# Patient Record
Sex: Female | Born: 1980 | Hispanic: Yes | Marital: Single | Smoking: Never smoker
Health system: Southern US, Community
[De-identification: ages and names within clinical notes are randomized; demographics above are authoritative.]

## PROBLEM LIST (undated history)

## (undated) DIAGNOSIS — Z789 Other specified health status: Secondary | ICD-10-CM

## (undated) HISTORY — DX: Other specified health status: Z78.9

## (undated) HISTORY — PX: NO PAST SURGERIES: SHX2092

---

## 2000-10-03 ENCOUNTER — Other Ambulatory Visit: Admission: RE | Admit: 2000-10-03 | Discharge: 2000-10-03 | Payer: Self-pay | Admitting: Obstetrics and Gynecology

## 2001-05-10 ENCOUNTER — Inpatient Hospital Stay (HOSPITAL_COMMUNITY): Admission: AD | Admit: 2001-05-10 | Discharge: 2001-05-11 | Payer: Self-pay | Admitting: *Deleted

## 2001-05-12 ENCOUNTER — Encounter: Admission: RE | Admit: 2001-05-12 | Discharge: 2001-06-11 | Payer: Self-pay | Admitting: Obstetrics and Gynecology

## 2005-09-16 LAB — HEMOGLOBIN EVAL RFX ELECTROPHORESIS: Hemoglobin Evaluation: NORMAL

## 2007-11-03 ENCOUNTER — Inpatient Hospital Stay (HOSPITAL_COMMUNITY): Admission: AD | Admit: 2007-11-03 | Discharge: 2007-11-03 | Payer: Self-pay | Admitting: Gynecology

## 2007-11-08 ENCOUNTER — Inpatient Hospital Stay (HOSPITAL_COMMUNITY): Admission: AD | Admit: 2007-11-08 | Discharge: 2007-11-08 | Payer: Self-pay | Admitting: Obstetrics and Gynecology

## 2008-01-07 ENCOUNTER — Ambulatory Visit (HOSPITAL_COMMUNITY): Admission: RE | Admit: 2008-01-07 | Discharge: 2008-01-07 | Payer: Self-pay | Admitting: Obstetrics & Gynecology

## 2008-03-29 ENCOUNTER — Inpatient Hospital Stay (HOSPITAL_COMMUNITY): Admission: AD | Admit: 2008-03-29 | Discharge: 2008-03-29 | Payer: Self-pay | Admitting: Obstetrics & Gynecology

## 2008-03-29 ENCOUNTER — Ambulatory Visit: Payer: Self-pay | Admitting: Advanced Practice Midwife

## 2008-05-23 ENCOUNTER — Ambulatory Visit: Payer: Self-pay | Admitting: Obstetrics and Gynecology

## 2008-05-23 ENCOUNTER — Inpatient Hospital Stay (HOSPITAL_COMMUNITY): Admission: AD | Admit: 2008-05-23 | Discharge: 2008-05-23 | Payer: Self-pay | Admitting: Obstetrics & Gynecology

## 2008-06-07 ENCOUNTER — Ambulatory Visit: Payer: Self-pay | Admitting: Obstetrics and Gynecology

## 2008-06-07 ENCOUNTER — Inpatient Hospital Stay (HOSPITAL_COMMUNITY): Admission: AD | Admit: 2008-06-07 | Discharge: 2008-06-07 | Payer: Self-pay | Admitting: Obstetrics & Gynecology

## 2008-06-08 ENCOUNTER — Inpatient Hospital Stay (HOSPITAL_COMMUNITY): Admission: AD | Admit: 2008-06-08 | Discharge: 2008-06-09 | Payer: Self-pay | Admitting: Obstetrics & Gynecology

## 2008-06-08 ENCOUNTER — Ambulatory Visit: Payer: Self-pay | Admitting: Family Medicine

## 2010-09-19 ENCOUNTER — Emergency Department (HOSPITAL_COMMUNITY)
Admission: EM | Admit: 2010-09-19 | Discharge: 2010-09-19 | Disposition: A | Discharge status: Home / Self Care | Payer: Self-pay | Attending: Emergency Medicine | Admitting: Emergency Medicine

## 2010-09-19 DIAGNOSIS — N938 Other specified abnormal uterine and vaginal bleeding: Secondary | ICD-10-CM | POA: Insufficient documentation

## 2010-09-19 DIAGNOSIS — N949 Unspecified condition associated with female genital organs and menstrual cycle: Secondary | ICD-10-CM | POA: Insufficient documentation

## 2010-09-19 LAB — DIFFERENTIAL
Basophils Absolute: 0 10*3/uL (ref 0.0–0.1)
Eosinophils Absolute: 0 10*3/uL (ref 0.0–0.7)
Eosinophils Relative: 1 % (ref 0–5)
Neutrophils Relative %: 54 % (ref 43–77)

## 2010-09-19 LAB — CBC
Platelets: 226 10*3/uL (ref 150–400)
RDW: 11.9 % (ref 11.5–15.5)
WBC: 6.8 10*3/uL (ref 4.0–10.5)

## 2010-09-19 LAB — BASIC METABOLIC PANEL
GFR calc Af Amer: >60 mL/min (ref >60)
GFR calc non Af Amer: >60 mL/min (ref >60)
Potassium: 3.7 mEq/L (ref 3.5–5.1)
Sodium: 135 mEq/L (ref 135–145)

## 2010-09-19 LAB — URINE MICROSCOPIC-ADD ON

## 2010-09-19 LAB — URINALYSIS, ROUTINE W REFLEX MICROSCOPIC
Bilirubin Urine: NEGATIVE
Nitrite: NEGATIVE
Specific Gravity, Urine: 1.013 (ref 1.005–1.030)
Urobilinogen, UA: 0.2 mg/dL (ref 0.0–1.0)

## 2010-09-19 LAB — POCT PREGNANCY, URINE: Preg Test, Ur: NEGATIVE

## 2011-03-27 LAB — CBC
MCHC: 34.4
MCV: 89.4
Platelets: 223
WBC: 8

## 2011-03-27 LAB — URINALYSIS, ROUTINE W REFLEX MICROSCOPIC
Bilirubin Urine: NEGATIVE
Ketones, ur: NEGATIVE
Nitrite: NEGATIVE
Protein, ur: NEGATIVE
Specific Gravity, Urine: 1.015
Urobilinogen, UA: 0.2

## 2011-03-27 LAB — HCG, QUANTITATIVE, PREGNANCY: hCG, Beta Chain, Quant, S: 154681 — ABNORMAL HIGH

## 2011-03-27 LAB — WET PREP, GENITAL
Clue Cells Wet Prep HPF POC: NONE SEEN
Trich, Wet Prep: NONE SEEN

## 2011-03-27 LAB — GC/CHLAMYDIA PROBE AMP, GENITAL: GC Probe Amp, Genital: NEGATIVE

## 2011-04-04 LAB — CBC
HCT: 34.2 % — ABNORMAL LOW (ref 36.0–46.0)
HCT: 38.7 % (ref 36.0–46.0)
Hemoglobin: 13.3 g/dL (ref 12.0–15.0)
MCHC: 34.5 g/dL (ref 30.0–36.0)
MCV: 96.7 fL (ref 78.0–100.0)
MCV: 97.3 fL (ref 78.0–100.0)
Platelets: 153 10*3/uL (ref 150–400)
RBC: 4 MIL/uL (ref 3.87–5.11)
RDW: 12.4 % (ref 11.5–15.5)

## 2011-07-02 NOTE — L&D Delivery Note (Signed)
Delivery Note At 9:47 AM a viable and healthy female was delivered via Vaginal, Spontaneous Delivery (Presentation: ; Occiput Anterior).  APGAR: 8, 9; weight pending.   Placenta status: Intact, Spontaneous.  Cord:  with the following complications: None.    Anesthesia:  epidural Episiotomy: none Lacerations: none Est. Blood Loss (mL): 300  Mom to postpartum.  Baby to nursery-stable.  Lawernce Pitts 04/05/2012, 10:02 AM  I was present for delivery and agree with note above. [facility]

## 2011-11-12 ENCOUNTER — Other Ambulatory Visit: Payer: Self-pay

## 2011-11-12 LAB — OB RESULTS CONSOLE ABO/RH: RH Type: POSITIVE

## 2011-11-12 LAB — OB RESULTS CONSOLE HIV ANTIBODY (ROUTINE TESTING): HIV: NONREACTIVE

## 2011-11-19 ENCOUNTER — Other Ambulatory Visit: Payer: Self-pay | Admitting: Obstetrics & Gynecology

## 2011-11-19 DIAGNOSIS — O280 Abnormal hematological finding on antenatal screening of mother: Secondary | ICD-10-CM

## 2011-11-20 ENCOUNTER — Encounter (HOSPITAL_COMMUNITY): Payer: Self-pay

## 2011-11-20 ENCOUNTER — Ambulatory Visit (HOSPITAL_COMMUNITY)
Admission: RE | Admit: 2011-11-20 | Discharge: 2011-11-20 | Disposition: A | Discharge status: Home / Self Care | Payer: Self-pay | Source: Ambulatory Visit | Attending: Obstetrics & Gynecology | Admitting: Obstetrics & Gynecology

## 2011-11-20 ENCOUNTER — Ambulatory Visit (HOSPITAL_COMMUNITY)
Admission: RE | Admit: 2011-11-20 | Discharge: 2011-11-20 | Disposition: A | Discharge status: Home / Self Care | Payer: Self-pay | Source: Ambulatory Visit | Attending: Nurse Practitioner | Admitting: Nurse Practitioner

## 2011-11-20 VITALS — BP 105/71 | HR 95 | Wt 172.0 lb

## 2011-11-20 DIAGNOSIS — O280 Abnormal hematological finding on antenatal screening of mother: Secondary | ICD-10-CM

## 2011-11-20 DIAGNOSIS — O358XX0 Maternal care for other (suspected) fetal abnormality and damage, not applicable or unspecified: Secondary | ICD-10-CM | POA: Insufficient documentation

## 2011-11-20 DIAGNOSIS — Z1389 Encounter for screening for other disorder: Secondary | ICD-10-CM | POA: Insufficient documentation

## 2011-11-20 DIAGNOSIS — O289 Unspecified abnormal findings on antenatal screening of mother: Secondary | ICD-10-CM | POA: Insufficient documentation

## 2011-11-20 DIAGNOSIS — Z363 Encounter for antenatal screening for malformations: Secondary | ICD-10-CM | POA: Insufficient documentation

## 2011-11-20 NOTE — Progress Notes (Signed)
Genetic Counseling  High-Risk Gestation Note  Appointment Date:  11/20/2011 Referred By: Currie Paris, NP Date of Birth:  09/15/1980 Partner:  Colleen Blanchard    Pregnancy History: G3P2 Estimated Date of Delivery: 04/13/2012 Attending: Alpha Gula, MD    Ms. Colleen Blanchard and her partner, Mr. Colleen Blanchard, were seen for genetic counseling because of an increased risk for fetal trisomy 18 based on Quad screening performed through [facility]. Spanish/English medical interpretation provided by [employer] interpreter.   They were counseled regarding the Quad screen result and the associated 1 in 69 risk for fetal trisomy 18.  We reviewed chromosomes, nondisjunction, and the features and poor prognosis of trisomy 69.  In addition, we reviewed the screen adjusted reduction in risks for Down syndrome (1 in 1,729) and ONTDs.  We also discussed other explanations for a screen positive result including: a gestational dating error, differences in maternal metabolism, and normal variation.  We reviewed other available screening and diagnostic options including detailed ultrasound and amniocentesis.  We discussed the risks, limitations, and benefits of each. We discussed another type of screening test, noninvasive prenatal testing (NIPT), which utilizes cell free fetal DNA found in the maternal circulation. This test is not diagnostic for chromosome conditions, but can provide information regarding the presence or absence of extra fetal DNA for chromosomes 13, 18 and 21. Thus, it would not identify or rule out all fetal aneuploidy. The reported detection rate is greater than 99% for Trisomy 21, greater than 97% for Trisomy 18, and is approximately 80% (8 out of 10) for Trisomy 13. The false positive rate is reported to be less than 1% for any of these conditions.  After thoughtful consideration of these options, Ms. Colleen Blanchard elected to have ultrasound, but declined cell free  fetal DNA testing and amniocentesis.  They understand that ultrasound cannot rule out all birth defects or genetic syndromes.  The patient was advised of this limitation and states she still does not want diagnostic testing at this time.   However, they were counseled that up to 90% of fetuses with trisomy 18, when well visualized, have detectable anomalies or soft markers by ultrasound.  A complete ultrasound was performed today.  The ultrasound report will be sent under separate cover.  Both family histories were reviewed and found to be contributory for a brother with Down syndrome of the father of the pregnancy. This brother is the youngest of 10 children, and the mother was reportedly 76 years old at delivery. No information was known regarding this relative's karyotype analysis. We discussed that 95% of cases of Down syndrome are not inherited and are the result of non-disjunction.  Three to 4% of cases of Down syndrome are the result of a translocation involving chromosome #21.  We discussed the option of chromosome analysis to determine if an individual is a carrier of a balanced translocation involving chromosome #21.  If an individual carries a balanced translocation involving chromosome #21, then the chance to have a baby with Down syndrome would be greater than the maternal age-related risk. We discussed that given the reported family history, this relative's Down syndrome was most likely sporadic, which would not be expected to increase recurrence risk for the current pregnancy above the a priori risk. However, additional information about this relative's karyotype may alter recurrence risk assessment.    Additionally, the father of the pregnancy reported a niece, a different brother's daughter, born with hydrocephalus requiring shunt surgery. She is currently 2  and a [age] years old and has mental delays. An underlying cause for the hydrocephalus is not known. Hydrocephalus can be isolated  (nonsyndromic) or seen as one feature of an underlying chromosome or genetic condition. Hydrocephalus is typically isolated and multifactorial, involving a combination of genetic and environmental contributing factors.  Rarely, nonsyndromic hydrocephalus can follow autosomal recessive or autosomal dominant inheritance. X-linked hydrocephalus is also observed in some families. We discussed that when isolated and when multifactorial inheritance is suspected, recurrence risk for full siblings is approximately 1-2%. Thus, given the reported family history and the degree of relation (a third degree relative to the current pregnancy), recurrence risk for the current pregnancy is likely low. Additional information regarding an underlying cause for hydrocephalus in the family may alter recurrence risk assessment. Targeted ultrasound is available to assess for features of hydrocephalus prenatally. However, the patient understands that ultrasound cannot diagnose or rule out all birth defects prenatally.   The father of the pregnancy was recently diagnosed with diabetes mellitus. He reported that his mother and father also have diabetes. We discussed the probable multifactorial inheritance of diabetes and explained that genetic testing for diabetes is not available at this time.  We discussed the potential increased risk to the patient and the fetus, and the importance of informing primary care physicians about this family history in order to obtain appropriate medical care and monitoring. Without further information regarding the provided family history, an accurate genetic risk cannot be calculated. Further genetic counseling is warranted if more information is obtained.  Ms. Colleen Blanchard denied exposure to environmental toxins or chemical agents. She denied the use of alcohol, tobacco or street drugs. She denied significant viral illnesses during the course of her pregnancy. Her medical and surgical histories were  noncontributory.   I counseled this couple for approximately 30 minutes regarding the above risks and available options.     Colleen Plowman, MS,  Certified [employer] 11/20/2011

## 2011-11-20 NOTE — Progress Notes (Signed)
Patient seen today  for ultrasound.  See full report in [facility].  Alpha Gula, MD  Patient is seen due to 1:69 risk for Trisomy 18 by quad screen.  Single IUP at 20 0/7 weeks Normal anatomic fetal survey.  Somewhat limited views of the fetal heart were obtained (outflow tracts, ductus) Normal hand posturing No markers associated with aneuploidy noted Normal amniotic fluid volume  Patient declined amniocentesis.   Recommend follow up ultrasound in 4 weeks to reevaluate the fetal heart.

## 2011-11-22 NOTE — Progress Notes (Signed)
Encounter addended by: Senaida Lange on: 11/22/2011  1:54 PM<BR>     Documentation filed: Episodes, Vitals Section

## 2011-12-18 ENCOUNTER — Ambulatory Visit (HOSPITAL_COMMUNITY)
Admission: RE | Admit: 2011-12-18 | Discharge: 2011-12-18 | Disposition: A | Discharge status: Home / Self Care | Payer: Self-pay | Source: Ambulatory Visit | Attending: Nurse Practitioner | Admitting: Nurse Practitioner

## 2011-12-18 ENCOUNTER — Encounter (HOSPITAL_COMMUNITY): Payer: Self-pay

## 2011-12-18 DIAGNOSIS — Z3689 Encounter for other specified antenatal screening: Secondary | ICD-10-CM | POA: Insufficient documentation

## 2011-12-18 DIAGNOSIS — O280 Abnormal hematological finding on antenatal screening of mother: Secondary | ICD-10-CM

## 2011-12-18 DIAGNOSIS — O289 Unspecified abnormal findings on antenatal screening of mother: Secondary | ICD-10-CM | POA: Insufficient documentation

## 2011-12-18 NOTE — Progress Notes (Signed)
Patient seen today  for follow up ultrasound.  See full report in [facility].  Alpha Gula, MD  Single IUP at 24 0/7 weeks Normal interval anatomy Fetal growth is appropriate (57th %tile) Normal amniotic fluid volume  Recommend follow up ultrasound as clinically indicated

## 2012-03-19 LAB — OB RESULTS CONSOLE GBS: GBS: NEGATIVE

## 2012-03-26 ENCOUNTER — Other Ambulatory Visit (HOSPITAL_COMMUNITY): Payer: Self-pay | Admitting: Nurse Practitioner

## 2012-03-26 DIAGNOSIS — O329XX0 Maternal care for malpresentation of fetus, unspecified, not applicable or unspecified: Secondary | ICD-10-CM

## 2012-03-27 ENCOUNTER — Ambulatory Visit (HOSPITAL_COMMUNITY)
Admission: RE | Admit: 2012-03-27 | Discharge: 2012-03-27 | Disposition: A | Discharge status: Home / Self Care | Payer: Self-pay | Source: Ambulatory Visit | Attending: Nurse Practitioner | Admitting: Nurse Practitioner

## 2012-03-27 DIAGNOSIS — O329XX0 Maternal care for malpresentation of fetus, unspecified, not applicable or unspecified: Secondary | ICD-10-CM | POA: Insufficient documentation

## 2012-04-04 ENCOUNTER — Encounter (HOSPITAL_COMMUNITY): Payer: Self-pay | Admitting: *Deleted

## 2012-04-04 ENCOUNTER — Inpatient Hospital Stay (HOSPITAL_COMMUNITY)
Admission: AD | Admit: 2012-04-04 | Discharge: 2012-04-06 | DRG: 775 | Disposition: A | Discharge status: Home / Self Care | Payer: Medicaid Other | Source: Ambulatory Visit | Attending: Obstetrics & Gynecology | Admitting: Obstetrics & Gynecology

## 2012-04-04 HISTORY — DX: Other specified health status: Z78.9

## 2012-04-04 NOTE — MAU Note (Signed)
Contractions since yest. At 1500. Were about apart. Awoke at 0300 with strong ctx. Since this am ctxs every .

## 2012-04-05 ENCOUNTER — Encounter (HOSPITAL_COMMUNITY): Payer: Self-pay | Admitting: Anesthesiology

## 2012-04-05 ENCOUNTER — Encounter (HOSPITAL_COMMUNITY): Payer: Self-pay | Admitting: *Deleted

## 2012-04-05 ENCOUNTER — Inpatient Hospital Stay (HOSPITAL_COMMUNITY): Payer: Medicaid Other | Admitting: Anesthesiology

## 2012-04-05 ENCOUNTER — Encounter (HOSPITAL_COMMUNITY): Payer: Self-pay | Admitting: Women's Health

## 2012-04-05 LAB — TYPE AND SCREEN: ABO/RH(D): O POS

## 2012-04-05 LAB — CBC
HCT: 36 % (ref 36.0–46.0)
MCHC: 34.4 g/dL (ref 30.0–36.0)
RDW: 12.3 % (ref 11.5–15.5)

## 2012-04-05 LAB — RPR: RPR Ser Ql: NONREACTIVE

## 2012-04-05 MED ORDER — ACETAMINOPHEN 325 MG PO TABS: 650.0000 mg | ORAL_TABLET | ORAL | Status: DC | PRN

## 2012-04-05 MED ORDER — OXYTOCIN 40 UNITS IN LACTATED RINGERS INFUSION - SIMPLE MED
62.5000 mL/h | Freq: Once | INTRAVENOUS | Status: AC
  Administered 2012-04-05: 62.5 mL/h via INTRAVENOUS
  Filled 2012-04-05: qty 1000

## 2012-04-05 MED ORDER — PHENYLEPHRINE 40 MCG/ML (10ML) SYRINGE FOR IV PUSH (FOR BLOOD PRESSURE SUPPORT): 80.0000 ug | PREFILLED_SYRINGE | INTRAVENOUS | Status: DC | PRN

## 2012-04-05 MED ORDER — BENZOCAINE-MENTHOL 20-0.5 % EX AERO: 1.0000 "application " | INHALATION_SPRAY | CUTANEOUS | Status: DC | PRN

## 2012-04-05 MED ORDER — DIBUCAINE 1 % RE OINT: 1.0000 "application " | TOPICAL_OINTMENT | RECTAL | Status: DC | PRN

## 2012-04-05 MED ORDER — ONDANSETRON HCL 4 MG/2ML IJ SOLN: 4.0000 mg | INTRAMUSCULAR | Status: DC | PRN

## 2012-04-05 MED ORDER — HYDROXYZINE HCL 50 MG PO TABS: 50.0000 mg | ORAL_TABLET | Freq: Four times a day (QID) | ORAL | Status: DC | PRN

## 2012-04-05 MED ORDER — LIDOCAINE HCL (PF) 1 % IJ SOLN
30.0000 mL | INTRAMUSCULAR | Status: DC | PRN
  Filled 2012-04-05: qty 30

## 2012-04-05 MED ORDER — OXYTOCIN BOLUS FROM INFUSION
500.0000 mL | Freq: Once | INTRAVENOUS | Status: DC
  Filled 2012-04-05: qty 500

## 2012-04-05 MED ORDER — LACTATED RINGERS IV SOLN: 500.0000 mL | INTRAVENOUS | Status: DC | PRN

## 2012-04-05 MED ORDER — IBUPROFEN 600 MG PO TABS
600.0000 mg | ORAL_TABLET | Freq: Four times a day (QID) | ORAL | Status: DC
  Administered 2012-04-05 – 2012-04-06 (×3): 600 mg via ORAL
  Filled 2012-04-05 (×4): qty 1

## 2012-04-05 MED ORDER — DIPHENHYDRAMINE HCL 50 MG/ML IJ SOLN: 12.5000 mg | INTRAMUSCULAR | Status: DC | PRN

## 2012-04-05 MED ORDER — SENNOSIDES-DOCUSATE SODIUM 8.6-50 MG PO TABS
2.0000 | ORAL_TABLET | Freq: Every day | ORAL | Status: DC
  Administered 2012-04-05: 2 via ORAL

## 2012-04-05 MED ORDER — EPHEDRINE 5 MG/ML INJ
10.0000 mg | INTRAVENOUS | Status: DC | PRN
  Filled 2012-04-05: qty 4

## 2012-04-05 MED ORDER — DIPHENHYDRAMINE HCL 25 MG PO CAPS: 25.0000 mg | ORAL_CAPSULE | Freq: Four times a day (QID) | ORAL | Status: DC | PRN

## 2012-04-05 MED ORDER — HYDROXYZINE HCL 50 MG/ML IM SOLN: 50.0000 mg | Freq: Four times a day (QID) | INTRAMUSCULAR | Status: DC | PRN

## 2012-04-05 MED ORDER — IBUPROFEN 600 MG PO TABS: 600.0000 mg | ORAL_TABLET | Freq: Four times a day (QID) | ORAL | Status: DC | PRN

## 2012-04-05 MED ORDER — EPHEDRINE 5 MG/ML INJ: 10.0000 mg | INTRAVENOUS | Status: DC | PRN

## 2012-04-05 MED ORDER — LACTATED RINGERS IV SOLN: 500.0000 mL | Freq: Once | INTRAVENOUS | Status: DC

## 2012-04-05 MED ORDER — ONDANSETRON HCL 4 MG PO TABS: 4.0000 mg | ORAL_TABLET | ORAL | Status: DC | PRN

## 2012-04-05 MED ORDER — NALBUPHINE SYRINGE 5 MG/0.5 ML: 5.0000 mg | INJECTION | INTRAMUSCULAR | Status: DC | PRN

## 2012-04-05 MED ORDER — LANOLIN HYDROUS EX OINT: TOPICAL_OINTMENT | CUTANEOUS | Status: DC | PRN

## 2012-04-05 MED ORDER — LACTATED RINGERS IV SOLN
INTRAVENOUS | Status: DC
  Administered 2012-04-05: 05:00:00 via INTRAVENOUS

## 2012-04-05 MED ORDER — CITRIC ACID-SODIUM CITRATE 334-500 MG/5ML PO SOLN: 30.0000 mL | ORAL | Status: DC | PRN

## 2012-04-05 MED ORDER — SIMETHICONE 80 MG PO CHEW: 80.0000 mg | CHEWABLE_TABLET | ORAL | Status: DC | PRN

## 2012-04-05 MED ORDER — FENTANYL 2.5 MCG/ML BUPIVACAINE 1/10 % EPIDURAL INFUSION (WH - ANES)
INTRAMUSCULAR | Status: DC | PRN
  Administered 2012-04-05: 14 mL/h via EPIDURAL

## 2012-04-05 MED ORDER — TETANUS-DIPHTH-ACELL PERTUSSIS 5-2.5-18.5 LF-MCG/0.5 IM SUSP: 0.5000 mL | Freq: Once | INTRAMUSCULAR | Status: DC

## 2012-04-05 MED ORDER — ONDANSETRON HCL 4 MG/2ML IJ SOLN: 4.0000 mg | Freq: Four times a day (QID) | INTRAMUSCULAR | Status: DC | PRN

## 2012-04-05 MED ORDER — FENTANYL 2.5 MCG/ML BUPIVACAINE 1/10 % EPIDURAL INFUSION (WH - ANES)
14.0000 mL/h | INTRAMUSCULAR | Status: DC
  Filled 2012-04-05: qty 123

## 2012-04-05 MED ORDER — MISOPROSTOL 200 MCG PO TABS
ORAL_TABLET | ORAL | Status: AC
  Filled 2012-04-05: qty 5

## 2012-04-05 MED ORDER — FLEET ENEMA 7-19 GM/118ML RE ENEM: 1.0000 | ENEMA | RECTAL | Status: DC | PRN

## 2012-04-05 MED ORDER — WITCH HAZEL-GLYCERIN EX PADS: 1.0000 "application " | MEDICATED_PAD | CUTANEOUS | Status: DC | PRN

## 2012-04-05 MED ORDER — PHENYLEPHRINE 40 MCG/ML (10ML) SYRINGE FOR IV PUSH (FOR BLOOD PRESSURE SUPPORT)
80.0000 ug | PREFILLED_SYRINGE | INTRAVENOUS | Status: DC | PRN
  Filled 2012-04-05: qty 5

## 2012-04-05 MED ORDER — OXYCODONE-ACETAMINOPHEN 5-325 MG PO TABS: 1.0000 | ORAL_TABLET | ORAL | Status: DC | PRN

## 2012-04-05 MED ORDER — LIDOCAINE HCL (PF) 1 % IJ SOLN
INTRAMUSCULAR | Status: DC | PRN
  Administered 2012-04-05 (×2): 9 mL

## 2012-04-05 MED ORDER — ZOLPIDEM TARTRATE 5 MG PO TABS: 5.0000 mg | ORAL_TABLET | Freq: Every evening | ORAL | Status: DC | PRN

## 2012-04-05 MED ORDER — OXYCODONE-ACETAMINOPHEN 5-325 MG PO TABS
1.0000 | ORAL_TABLET | ORAL | Status: DC | PRN
  Administered 2012-04-06: 1 via ORAL
  Filled 2012-04-05: qty 1

## 2012-04-05 MED ORDER — PRENATAL MULTIVITAMIN CH
1.0000 | ORAL_TABLET | Freq: Every day | ORAL | Status: DC
  Administered 2012-04-06: 1 via ORAL
  Filled 2012-04-05: qty 1

## 2012-04-05 NOTE — Anesthesia Preprocedure Evaluation (Signed)
Anesthesia Evaluation  Patient identified by MRN, date of birth, ID band Patient awake    Reviewed: Allergy & Precautions, H&P , Patient's Chart, lab work & pertinent test results  Airway Mallampati: II TM Distance: >3 FB Neck ROM: full    Dental No notable dental hx.    Pulmonary neg pulmonary ROS,  breath sounds clear to auscultation  Pulmonary exam normal       Cardiovascular negative cardio ROS  Rhythm:regular     Neuro/Psych negative neurological ROS  negative psych ROS   GI/Hepatic negative GI ROS, Neg liver ROS,   Endo/Other  negative endocrine ROS  Renal/GU negative Renal ROS  negative genitourinary   Musculoskeletal negative musculoskeletal ROS (+)   Abdominal Normal abdominal exam  (+)   Peds negative pediatric ROS (+)  Hematology negative hematology ROS (+)   Anesthesia Other Findings   Reproductive/Obstetrics (+) Pregnancy                           Anesthesia Physical Anesthesia Plan  ASA: II  Anesthesia Plan: Epidural   Post-op Pain Management:    Induction:   Airway Management Planned:   Additional Equipment:   Intra-op Plan:   Post-operative Plan:   Informed Consent: I have reviewed the patients History and Physical, chart, labs and discussed the procedure including the risks, benefits and alternatives for the proposed anesthesia with the patient or authorized representative who has indicated his/her understanding and acceptance.     Plan Discussed with:   Anesthesia Plan Comments:         Anesthesia Quick Evaluation

## 2012-04-05 NOTE — MAU Provider Note (Signed)
Please see H&P Cheral Marker, CNM, WHNP-BC

## 2012-04-05 NOTE — Anesthesia Procedure Notes (Signed)
Epidural Patient location during procedure: OB Start time: 04/05/2012 4:59 AM End time: 04/05/2012 5:04 AM  Staffing Anesthesiologist: Sandrea Hughs Performed by: anesthesiologist   Preanesthetic Checklist Completed: patient identified, site marked, surgical consent, pre-op evaluation, timeout performed, IV checked, risks and benefits discussed and monitors and equipment checked  Epidural Patient position: sitting Prep: site prepped and draped and DuraPrep Patient monitoring: continuous pulse ox and blood pressure Approach: midline Injection technique: LOR air  Needle:  Needle type: Tuohy  Needle gauge: 17 G Needle length: 9 cm and 9 Needle insertion depth: 6 cm Catheter type: closed end flexible Catheter size: 19 Gauge Catheter at skin depth: 11 cm Test dose: negative and Other  Assessment Sensory level: T8 Events: blood not aspirated, injection not painful, no injection resistance, negative IV test and no paresthesia  Additional Notes Reason for block:procedure for pain

## 2012-04-05 NOTE — H&P (Signed)
Colleen Blanchard is a 31 y.o. [phone] spanish-speaking female at [redacted]w[redacted]d by certain LMP presenting for report of uc's over last few days, worsening in frequency and intensity since this evening.  Denies LOF or VB.  Reports good FM. Denies ha, scotomata, ruq/epigastric pain, n/v.  Late onset of care at 18.3wks at [facility]. Pelvis proven to 9lb. H/O PPH after birth of 2nd child.  1:69 r/f Trisomy 18 on quad screen, normal level II u/s, declined amnio.   Exam in presence of spanish interpreter.     Maternal Medical History:  Reason for admission: Reason for admission: contractions.  Contractions: Onset was 6-12 hours ago.   Frequency: irregular.   Perceived severity is moderate.    Fetal activity: Perceived fetal activity is normal.   Last perceived fetal movement was within the past hour.    Prenatal complications: no prenatal complications Prenatal Complications - Diabetes: none.    OB History    Grav Para Term Preterm Abortions TAB SAB Ect Mult Living   4 2 2  1  1   2      Past Medical History  Diagnosis Date  . Postpartum hemorrhage 2009   History reviewed. No pertinent past surgical history. Family History: family history includes Hypertension in her father. Social History:  reports that she has never smoked. She has never used smokeless tobacco. She reports that she does not drink alcohol or use illicit drugs.   Prenatal Transfer Tool  Maternal Diabetes: No Genetic Screening: Abnormal:  Results: Elevated risk of Trisomy 18, Other: normal level II u/s, declined amnio Maternal Ultrasounds/Referrals: Normal Fetal Ultrasounds or other Referrals:  None Maternal Substance Abuse:  No Significant Maternal Medications:  None Significant Maternal Lab Results:  Lab values include: Group B Strep negative Other Comments:  Late onset pnc @ 18.3wks  Review of Systems  Constitutional: Negative.   HENT: Negative.   Eyes: Negative.   Respiratory: Negative.   Cardiovascular: Negative.     Gastrointestinal: Positive for abdominal pain (r/t uc's).  Genitourinary: Negative.   Musculoskeletal: Negative.   Skin: Negative.   Neurological: Negative.   Endo/Heme/Allergies: Negative.   Psychiatric/Behavioral: Negative.     Dilation: 5 Effacement (%): 60 Station: -2 Exam by:: L. Munford RN Blood pressure 124/73, pulse 87, resp. rate 18, last menstrual period 07/03/2011. Maternal Exam:  Uterine Assessment: Contraction strength is moderate.  Contraction frequency is irregular.   Abdomen: Patient reports no abdominal tenderness. Fetal presentation: vertex gravid  Introitus: Normal vulva. Normal vagina.  Cervix: Cervix evaluated by digital exam.   By RN  Fetal Exam Fetal Monitor Review: Mode: ultrasound.   Baseline rate: 135.  Variability: moderate (6-25 bpm).   Pattern: accelerations present and no decelerations.    Fetal State Assessment: Category I - tracings are normal.     Physical Exam  Constitutional: She is oriented to person, place, and time. She appears well-developed and well-nourished.  HENT:  Head: Normocephalic.  Neck: Normal range of motion.  Cardiovascular: Normal rate and regular rhythm.   Respiratory: Effort normal and breath sounds normal.  GI: Soft.       gravid  Genitourinary: Vagina normal and uterus normal.  Musculoskeletal: Normal range of motion. She exhibits no edema.  Neurological: She is alert and oriented to person, place, and time. She has normal reflexes.  Skin: Skin is warm and dry.  Psychiatric: She has a normal mood and affect. Her behavior is normal. Judgment and thought content normal.    Prenatal labs: ABO, Rh: O/Positive/-- (  05/14 0000) Antibody: Negative (05/14 0000) Rubella: Nonimmune (05/14 0000) RPR: Nonreactive (05/14 0000)  HBsAg: Negative (05/14 0000)  HIV: Non-reactive (05/14 0000)  GBS: Negative (09/19 0000)   Assessment/Plan: A:  [redacted]w[redacted]d SIUP  Active labor  Cat I FHR  GBS neg  1:69 r/f T18 on quad  screen, level II u/s wnl, declined amnio  H/O LGA infant and PPH  P:  Admit to BS  Expectant management  IV pain meds/epidural available at pt request  Anticipate NSVD   [name] 04/05/2012, 1:41 AM

## 2012-04-05 NOTE — Progress Notes (Signed)
Colleen Blanchard is a 31 y.o. [phone] at [redacted]w[redacted]d admitted for active labor  Subjective: Comfortable w/ epidural  Objective: BP 128/90  Pulse 73  Temp 97.8 F (36.6 C) (Oral)  Resp 16  Ht 5\' 3"  (1.6 m)  Wt 81.647 kg (180 lb)  BMI 31.89 kg/m2  LMP 07/06/2011      FHT:  FHR: 130 bpm, variability: moderate,  accelerations:  Present,  decelerations:  Present earlies UC:   regular, every 2-4 minutes SVE:   Dilation: 7.5 Effacement (%): 80 Station: -2 Exam by:: Lcarpenter,RN Large amount of clear fluid noted around time of epidural  Labs: Lab Results  Component Value Date   WBC 8.6 04/05/2012   HGB 12.4 04/05/2012   HCT 36.0 04/05/2012   MCV 93.3 04/05/2012   PLT 166 04/05/2012    Assessment / Plan: Spontaneous labor, progressing normally  Labor: Progressing normally Preeclampsia:  n/a Fetal Wellbeing:  Category I Pain Control:  Epidural I/D:  n/a Anticipated MOD:  NSVD  [name] 04/05/2012, 6:07 AM

## 2012-04-05 NOTE — H&P (Signed)
Attestation of Attending Supervision of Advanced Practitioner (CNM/NP): Evaluation and management procedures were performed by the Advanced Practitioner under my supervision and collaboration.  I have reviewed the Advanced Practitioner's note and chart, and I agree with the management and plan.  HARRAWAY-SMITH, Avri Paiva 8:30 AM

## 2012-04-05 NOTE — MAU Provider Note (Signed)
Attestation of Attending Supervision of Advanced Practitioner (CNM/NP): Evaluation and management procedures were performed by the Advanced Practitioner under my supervision and collaboration.  I have reviewed the Advanced Practitioner's note and chart, and I agree with the management and plan.  HARRAWAY-SMITH, Elmor Kost 8:31 AM     

## 2012-04-05 NOTE — Progress Notes (Signed)
Laranda Burkemper is a 31 y.o. [phone] at [redacted]w[redacted]d admitted for active labor  Subjective: Comfortable, fob supportive at bs.  Objective: BP 126/79  Pulse 88  Temp 97.8 F (36.6 C) (Oral)  Resp 20  Ht 5\' 3"  (1.6 m)  Wt 81.647 kg (180 lb)  BMI 31.89 kg/m2  LMP 07/06/2011      FHT:  FHR: 135 bpm, variability: moderate,  accelerations:  Present,  decelerations:  Absent UC:   irregular, every 4-6 minutes SVE:   Dilation: 5.5 Effacement (%): 60 Station: -2 Exam by:: k Johncarlo Maalouf cnm BBOW AROM, felt bag shear, but no fluid return- will continue to monitor for forebag Labs: Lab Results  Component Value Date   WBC 8.6 04/05/2012   HGB 12.4 04/05/2012   HCT 36.0 04/05/2012   MCV 93.3 04/05/2012   PLT 166 04/05/2012    Assessment / Plan: Spontaneous labor, progressing normally, now arom'd  Labor: Progressing normally Preeclampsia:  n/a Fetal Wellbeing:  Category I Pain Control:  Labor support without medications I/D:  n/a Anticipated MOD:  NSVD  [name] 04/05/2012, 4:09 AM

## 2012-04-06 MED ORDER — MEASLES, MUMPS & RUBELLA VAC ~~LOC~~ INJ
0.5000 mL | INJECTION | Freq: Once | SUBCUTANEOUS | Status: AC
  Administered 2012-04-06: 0.5 mL via SUBCUTANEOUS
  Filled 2012-04-06: qty 0.5

## 2012-04-06 NOTE — Discharge Summary (Signed)
Obstetric Discharge Summary Reason for Admission: onset of labor Prenatal Procedures: none Intrapartum Procedures: spontaneous vaginal delivery Postpartum Procedures: none Complications-Operative and Postpartum: none Hemoglobin  Date Value Range Status  04/05/2012 12.4  12.0 - 15.0 g/dL Final     HCT  Date Value Range Status  04/05/2012 36.0  36.0 - 46.0 % Final    Physical Exam:  General: alert, cooperative, appears stated age and no distress Lochia: appropriate Uterine Fundus: firm    Discharge Diagnoses: Term Pregnancy-delivered  Discharge Information: Date: 04/06/2012 Activity: unrestricted Diet: routine Medications: Ibuprofen and depo-provera Condition: stable Instructions: refer to practice specific booklet Discharge to: home   Newborn Data: Live born female  Birth Weight: 7 lb 13.2 oz (3550 g) APGAR: 8, 9  Home with mother.  Tawnya Crook 04/06/2012, 7:00 AM

## 2012-04-06 NOTE — Anesthesia Postprocedure Evaluation (Signed)
  Anesthesia Post-op Note  Patient: Colleen Blanchard  Procedure(s) Performed: * No procedures listed *  Patient Location: Mother/Baby  Anesthesia Type: Epidural  Level of Consciousness: awake, alert  and oriented  Airway and Oxygen Therapy: Patient Spontanous Breathing  Post-op Pain: none  Post-op Assessment: Post-op Vital signs reviewed and Patient's Cardiovascular Status Stable  Post-op Vital Signs: Reviewed and stable  Complications: No apparent anesthesia complications

## 2012-04-06 NOTE — Progress Notes (Signed)
UR chart review completed.  

## 2014-05-02 ENCOUNTER — Encounter (HOSPITAL_COMMUNITY): Payer: Self-pay | Admitting: *Deleted

## 2016-08-04 ENCOUNTER — Encounter (HOSPITAL_COMMUNITY): Payer: Self-pay

## 2016-08-04 ENCOUNTER — Emergency Department (HOSPITAL_COMMUNITY)
Admission: EM | Admit: 2016-08-04 | Discharge: 2016-08-05 | Disposition: A | Discharge status: Home / Self Care | Payer: Medicaid Other | Attending: Emergency Medicine | Admitting: Emergency Medicine

## 2016-08-04 DIAGNOSIS — L509 Urticaria, unspecified: Secondary | ICD-10-CM

## 2016-08-04 MED ORDER — SODIUM CHLORIDE 0.9 % IV BOLUS (SEPSIS)
1000.0000 mL | Freq: Once | INTRAVENOUS | Status: AC
  Administered 2016-08-04: 1000 mL via INTRAVENOUS

## 2016-08-04 MED ORDER — FAMOTIDINE IN NACL 20-0.9 MG/50ML-% IV SOLN
20.0000 mg | Freq: Once | INTRAVENOUS | Status: AC
Start: 2016-08-04 — End: 2016-08-04
  Administered 2016-08-04: 20 mg via INTRAVENOUS
  Filled 2016-08-04: qty 50

## 2016-08-04 MED ORDER — METHYLPREDNISOLONE SODIUM SUCC 125 MG IJ SOLR
125.0000 mg | Freq: Once | INTRAMUSCULAR | Status: AC
  Administered 2016-08-04: 125 mg via INTRAVENOUS
  Filled 2016-08-04: qty 2

## 2016-08-04 MED ORDER — DIPHENHYDRAMINE HCL 50 MG/ML IJ SOLN
50.0000 mg | Freq: Once | INTRAMUSCULAR | Status: AC
  Administered 2016-08-04: 50 mg via INTRAVENOUS
  Filled 2016-08-04: qty 1

## 2016-08-04 NOTE — ED Triage Notes (Signed)
Pt complaining of body rash and itching x 2 days. Pt believes allergic reaction to unknown substance. Pt denies any new body wash, detergents or perfumes. Pt also complaining of generalized body aches and fever/chills. Pt with NO respiratory distress at triage. NAD. VSS.

## 2016-08-05 LAB — CBC
HCT: 31.3 % — ABNORMAL LOW (ref 36.0–46.0)
Hemoglobin: 10.7 g/dL — ABNORMAL LOW (ref 12.0–15.0)
MCH: 30 pg (ref 26.0–34.0)
MCHC: 34.2 g/dL (ref 30.0–36.0)
MCV: 87.7 fL (ref 78.0–100.0)
Platelets: 167 10*3/uL (ref 150–400)
RBC: 3.57 MIL/uL — ABNORMAL LOW (ref 3.87–5.11)
RDW: 12.1 % (ref 11.5–15.5)
WBC: 4.4 10*3/uL (ref 4.0–10.5)

## 2016-08-05 LAB — BASIC METABOLIC PANEL
Anion gap: 8 (ref 5–15)
BUN: 8 mg/dL (ref 6–20)
CALCIUM: 7.3 mg/dL — AB (ref 8.9–10.3)
CO2: 21 mmol/L — ABNORMAL LOW (ref 22–32)
Chloride: 110 mmol/L (ref 101–111)
Creatinine, Ser: 0.56 mg/dL (ref 0.44–1.00)
GFR calc Af Amer: >60 mL/min (ref >60)
GLUCOSE: 97 mg/dL (ref 65–99)
Potassium: 3.1 mmol/L — ABNORMAL LOW (ref 3.5–5.1)
Sodium: 139 mmol/L (ref 135–145)

## 2016-08-05 LAB — I-STAT BETA HCG BLOOD, ED (MC, WL, AP ONLY): I-stat hCG, quantitative: <5 m[IU]/mL (ref <5)

## 2016-08-05 MED ORDER — PREDNISONE 20 MG PO TABS: 60.0000 mg | ORAL_TABLET | Freq: Every day | ORAL | 0 refills | Status: AC

## 2016-08-05 MED ORDER — RANITIDINE HCL 150 MG PO TABS: 150.0000 mg | ORAL_TABLET | Freq: Two times a day (BID) | ORAL | 0 refills | Status: DC

## 2016-08-05 MED ORDER — DIPHENHYDRAMINE HCL 25 MG PO TABS: 25.0000 mg | ORAL_TABLET | Freq: Four times a day (QID) | ORAL | 0 refills | Status: DC | PRN

## 2016-08-05 MED ORDER — POTASSIUM CHLORIDE CRYS ER 20 MEQ PO TBCR
60.0000 meq | EXTENDED_RELEASE_TABLET | Freq: Once | ORAL | Status: AC
  Administered 2016-08-05: 60 meq via ORAL
  Filled 2016-08-05: qty 3

## 2016-08-05 NOTE — ED Provider Notes (Signed)
[facility]-EMERGENCY DEPT Provider Note   CSN: [id number] Arrival date & time: 08/04/16  2033     History   Chief Complaint Chief Complaint  Patient presents with  . Rash  . Generalized Body Aches    HPI Colleen Blanchard is a 36 y.o. female.  HPI 36 yo F with PMHx as below here with generalized rash. Pt states that she woke up yesterday with diffuse, pruritic, red rash across her entire body. She also had chills and her feet "felt hot." She denies any known exposures, new detergents, new soaps, new clothes. She did have "cream" the night before that she has not had before. Since then, her rash has come and gone. She tried Zyrtec without success. The rash returned today and she felt "cold all over" so presents for evaluation. No vomiting, wheezing, lip swelling, tongue swelling, or voice changes.  Past Medical History:  Diagnosis Date  . No pertinent past medical history   . Postpartum hemorrhage 2009    There are no active problems to display for this patient.   Past Surgical History:  Procedure Laterality Date  . NO PAST SURGERIES      OB History    Gravida Para Term Preterm AB Living   4 3 3   1 3    SAB TAB Ectopic Multiple Live Births   1       3       Home Medications    Prior to Admission medications   Medication Sig Start Date End Date Taking? Authorizing Provider  diphenhydrAMINE (BENADRYL) 25 MG tablet Take 1 tablet (25 mg total) by mouth every 6 (six) hours as needed. 08/05/16   Shaune Pollack, MD  predniSONE (DELTASONE) 20 MG tablet Take 3 tablets (60 mg total) by mouth daily. 08/05/16 08/10/16  Shaune Pollack, MD  ranitidine (ZANTAC) 150 MG tablet Take 1 tablet (150 mg total) by mouth 2 (two) times daily. 08/05/16 08/10/16  Shaune Pollack, MD    Family History Family History  Problem Relation Age of Onset  . Hypertension Father     Social History Social History  Substance Use Topics  . Smoking status: Never Smoker  . Smokeless tobacco: Never Used  .  Alcohol use No     Allergies   Patient has no known allergies.   Review of Systems Review of Systems  Constitutional: Negative for chills, fatigue and fever.  HENT: Negative for congestion and rhinorrhea.   Eyes: Negative for visual disturbance.  Respiratory: Negative for cough, shortness of breath and wheezing.   Cardiovascular: Negative for chest pain and leg swelling.  Gastrointestinal: Negative for abdominal pain, diarrhea, nausea and vomiting.  Genitourinary: Negative for dysuria and flank pain.  Musculoskeletal: Negative for neck pain and neck stiffness.  Skin: Positive for rash. Negative for wound.  Allergic/Immunologic: Negative for immunocompromised state.  Neurological: Negative for syncope, weakness and headaches.  All other systems reviewed and are negative.    Physical Exam Updated Vital Signs BP 125/73   Pulse 94   Temp 97.8 F (36.6 C) (Oral)   Resp 18   Ht 5\' 5"  (1.651 m)   Wt 160 lb (72.6 kg)   SpO2 100%   BMI 26.63 kg/m   Physical Exam  Constitutional: She is oriented to person, place, and time. She appears well-developed and well-nourished. No distress.  HENT:  Head: Normocephalic and atraumatic.  No lip or tongue swelling  Eyes: Conjunctivae are normal.  Neck: Neck supple.  Cardiovascular: Normal rate, regular rhythm and  normal heart sounds.  Exam reveals no friction rub.   No murmur heard. Pulmonary/Chest: Effort normal and breath sounds normal. No stridor. No respiratory distress. She has no wheezes. She has no rales.  Abdominal: She exhibits no distension.  Musculoskeletal: She exhibits no edema.  Neurological: She is alert and oriented to person, place, and time. She exhibits normal muscle tone.  Skin: Skin is warm. Capillary refill takes less than 2 seconds. Rash (mild, diffuse urticaria; no induration; no fluctuance) noted.  Psychiatric: She has a normal mood and affect.  Nursing note and vitals reviewed.    ED Treatments / Results    Labs (all labs ordered are listed, but only abnormal results are displayed) Labs Reviewed  CBC - Abnormal; Notable for the following:       Result Value   RBC 3.57 (*)    Hemoglobin 10.7 (*)    HCT 31.3 (*)    All other components within normal limits  BASIC METABOLIC PANEL - Abnormal; Notable for the following:    Potassium 3.1 (*)    CO2 21 (*)    Calcium 7.3 (*)    All other components within normal limits  I-STAT BETA HCG BLOOD, ED ([facility], [facility], [facility] ONLY)    EKG  EKG Interpretation None       Radiology No results found.  Procedures Procedures (including critical care time)  Medications Ordered in ED Medications  diphenhydrAMINE (BENADRYL) injection 50 mg (50 mg Intravenous Given 08/04/16 2252)  famotidine (PEPCID) IVPB 20 mg premix (0 mg Intravenous Stopped 08/04/16 2342)  methylPREDNISolone sodium succinate (SOLU-MEDROL) 125 mg/2 mL injection 125 mg (125 mg Intravenous Given 08/04/16 2252)  sodium chloride 0.9 % bolus 1,000 mL (0 mLs Intravenous Stopped 08/05/16 0002)  potassium chloride SA (K-DUR,KLOR-CON) CR tablet 60 mEq (60 mEq Oral Given 08/05/16 0044)     Initial Impression / Assessment and Plan / ED Course  I have reviewed the triage vital signs and the nursing notes.  Pertinent labs & imaging results that were available during my care of the patient were reviewed by me and considered in my medical decision making (see chart for details).    36 yo F with PMHx as above here with diffuse urticaria, likely 2/2 mild allergic/cutaneous reaction versus viral urticaria. She is o/w very well appearing. Lab work unremarkable with exception of possible mild dehydration. No lip swelling, tongue swelling, or signs of anaphylaxis. Sx improved with benadryl, zantac, steroids here. Will d/c with oupt anthistamines and steroids.  Final Clinical Impressions(s) / ED Diagnoses   Final diagnoses:  Urticaria    New Prescriptions Discharge Medication List as of 08/05/2016 12:37 AM     START taking these medications   Details  predniSONE (DELTASONE) 20 MG tablet Take 3 tablets (60 mg total) by mouth daily., Starting Mon 08/05/2016, Until Sat 08/10/2016, Print    ranitidine (ZANTAC) 150 MG tablet Take 1 tablet (150 mg total) by mouth 2 (two) times daily., Starting Mon 08/05/2016, Until Sat 08/10/2016, Print         Shaune Pollackameron Esbeidy Mclaine, MD 08/05/16 [phone]

## 2018-07-01 NOTE — L&D Delivery Note (Signed)
  Delivery Note Pt c/o urge to push, Vtx at +3.  AROM w/clear fluid. TXA 1000mg  IV started.  After a brief 2nd stage, at 7:06 PM a viable female was delivered via Vaginal, Spontaneous (Presentation: LOA  ).  APGAR: 9, 9; weight pending.  After 1 minute, the cord was clamped and cut. 40 units of pitocin diluted in 1000cc LR was infused rapidly IV.  The placenta separated spontaneously and delivered via CCT and maternal pushing effort.  It was inspected and appears to be intact with a 3 VC.    Marland Kitchen   Anesthesia:  E;pidrual Episiotomy: None Lacerations: None Suture Repair:  Est. Blood Loss (mL): 574  Mom to postpartum.  Baby to Couplet care / Skin to Skin    Today is also this baby's father's birthday!  Colleen Blanchard 02/11/2019, 7:33 PM

## 2018-07-23 LAB — OB RESULTS CONSOLE HIV ANTIBODY (ROUTINE TESTING): HIV: NONREACTIVE

## 2018-07-23 LAB — CYTOLOGY - PAP
Cystic Fibrosis Profile: NEGATIVE
Drug Screen, Urine: NEGATIVE
Glucose 1 Hour: 135
PAP SMEAR: NEGATIVE

## 2018-07-23 LAB — OB RESULTS CONSOLE HEPATITIS B SURFACE ANTIGEN: Hepatitis B Surface Ag: NEGATIVE

## 2018-07-23 LAB — OB RESULTS CONSOLE HGB/HCT, BLOOD
HCT: 37 (ref 29–41)
Hemoglobin: 12.1

## 2018-07-23 LAB — OB RESULTS CONSOLE ABO/RH: RH Type: POSITIVE

## 2018-07-23 LAB — OB RESULTS CONSOLE GC/CHLAMYDIA
Chlamydia: NEGATIVE
Gonorrhea: NEGATIVE

## 2018-07-23 LAB — OB RESULTS CONSOLE GBS: GBS: POSITIVE

## 2018-07-23 LAB — OB RESULTS CONSOLE PLATELET COUNT: Platelets: 213

## 2018-07-23 LAB — OB RESULTS CONSOLE ANTIBODY SCREEN: Antibody Screen: NEGATIVE

## 2018-07-23 LAB — OB RESULTS CONSOLE RPR: RPR: NONREACTIVE

## 2018-07-23 LAB — OB RESULTS CONSOLE RUBELLA ANTIBODY, IGM: Rubella: IMMUNE

## 2018-07-24 ENCOUNTER — Other Ambulatory Visit (HOSPITAL_COMMUNITY): Payer: Self-pay | Admitting: Nurse Practitioner

## 2018-07-24 DIAGNOSIS — Z369 Encounter for antenatal screening, unspecified: Secondary | ICD-10-CM

## 2018-07-24 LAB — GLUCOSE, 3 HOUR GESTATIONAL
Glucose 1 Hour: 143
Glucose 2 Hour: 107
Glucose 3 Hour: 57
Glucose Tolerance, Fasting: 87 (ref 70–99)

## 2018-07-28 ENCOUNTER — Encounter (HOSPITAL_COMMUNITY): Payer: Self-pay

## 2018-08-03 ENCOUNTER — Encounter (HOSPITAL_COMMUNITY): Payer: Self-pay | Admitting: *Deleted

## 2018-08-03 ENCOUNTER — Other Ambulatory Visit: Payer: Self-pay

## 2018-08-05 ENCOUNTER — Ambulatory Visit (HOSPITAL_COMMUNITY)
Admission: RE | Admit: 2018-08-05 | Discharge: 2018-08-05 | Disposition: A | Discharge status: Home / Self Care | Payer: Self-pay | Source: Ambulatory Visit | Attending: Nurse Practitioner | Admitting: Nurse Practitioner

## 2018-08-05 ENCOUNTER — Ambulatory Visit (HOSPITAL_COMMUNITY): Payer: Self-pay | Admitting: Nurse Practitioner

## 2018-08-05 ENCOUNTER — Encounter (HOSPITAL_COMMUNITY): Payer: Self-pay

## 2018-08-05 ENCOUNTER — Ambulatory Visit (HOSPITAL_COMMUNITY)
Admission: RE | Admit: 2018-08-05 | Discharge: 2018-08-05 | Disposition: A | Discharge status: Home / Self Care | Payer: Medicaid Other | Source: Ambulatory Visit | Attending: Nurse Practitioner | Admitting: Nurse Practitioner

## 2018-08-05 DIAGNOSIS — Z3682 Encounter for antenatal screening for nuchal translucency: Secondary | ICD-10-CM

## 2018-08-05 DIAGNOSIS — Z3A13 13 weeks gestation of pregnancy: Secondary | ICD-10-CM

## 2018-08-05 DIAGNOSIS — O09521 Supervision of elderly multigravida, first trimester: Secondary | ICD-10-CM

## 2018-08-05 DIAGNOSIS — Z369 Encounter for antenatal screening, unspecified: Secondary | ICD-10-CM | POA: Insufficient documentation

## 2018-08-05 NOTE — ED Notes (Signed)
Patient and interpreter in session with Genetic Counselor, Moody Bruins.

## 2018-08-09 ENCOUNTER — Other Ambulatory Visit (HOSPITAL_COMMUNITY): Payer: Self-pay | Admitting: Nurse Practitioner

## 2018-11-30 ENCOUNTER — Ambulatory Visit (INDEPENDENT_AMBULATORY_CARE_PROVIDER_SITE_OTHER): Payer: Self-pay | Admitting: Obstetrics and Gynecology

## 2018-11-30 ENCOUNTER — Encounter: Payer: Self-pay | Admitting: Obstetrics and Gynecology

## 2018-11-30 ENCOUNTER — Other Ambulatory Visit: Payer: Self-pay

## 2018-11-30 DIAGNOSIS — O99810 Abnormal glucose complicating pregnancy: Secondary | ICD-10-CM

## 2018-11-30 DIAGNOSIS — O09529 Supervision of elderly multigravida, unspecified trimester: Secondary | ICD-10-CM | POA: Insufficient documentation

## 2018-11-30 DIAGNOSIS — Z3A30 30 weeks gestation of pregnancy: Secondary | ICD-10-CM

## 2018-11-30 DIAGNOSIS — O4400 Placenta previa specified as without hemorrhage, unspecified trimester: Secondary | ICD-10-CM | POA: Insufficient documentation

## 2018-11-30 DIAGNOSIS — Z789 Other specified health status: Secondary | ICD-10-CM

## 2018-11-30 DIAGNOSIS — B951 Streptococcus, group B, as the cause of diseases classified elsewhere: Secondary | ICD-10-CM | POA: Insufficient documentation

## 2018-11-30 DIAGNOSIS — O0990 Supervision of high risk pregnancy, unspecified, unspecified trimester: Secondary | ICD-10-CM

## 2018-11-30 DIAGNOSIS — O09523 Supervision of elderly multigravida, third trimester: Secondary | ICD-10-CM

## 2018-11-30 DIAGNOSIS — O2340 Unspecified infection of urinary tract in pregnancy, unspecified trimester: Secondary | ICD-10-CM | POA: Insufficient documentation

## 2018-11-30 DIAGNOSIS — O4403 Placenta previa specified as without hemorrhage, third trimester: Secondary | ICD-10-CM

## 2018-11-30 DIAGNOSIS — O2343 Unspecified infection of urinary tract in pregnancy, third trimester: Secondary | ICD-10-CM

## 2018-12-02 DIAGNOSIS — Z789 Other specified health status: Secondary | ICD-10-CM | POA: Insufficient documentation

## 2018-12-02 NOTE — Progress Notes (Signed)
Prenatal Visit Note Date: 11/30/2018 Clinic: [facility]  Transfer of care visit from [facility] for persistent posterior previa.   Subjective:  Colleen Blanchard is a 38 y.o. [phone] at [redacted]w[redacted]d being seen today for ongoing prenatal care.  She is currently monitored for the following issues for this high-risk pregnancy and has Abnormal glucose affecting pregnancy; AMA (advanced maternal age) multigravida 35+; Placenta previa; Supervision of high risk pregnancy, antepartum; and GBS (group B streptococcus) UTI complicating pregnancy on their problem list.  Patient reports no complaints.   Contractions: Not present.  .  Movement: Present. Denies leaking of fluid.   The following portions of the patient's history were reviewed and updated as appropriate: allergies, current medications, past family history, past medical history, past social history, past surgical history and problem list. Problem list updated.  Objective:   Vitals:   11/30/18 1541  BP: 119/72  Pulse: 87  Temp: 98.5 F (36.9 C)  Weight: 191 lb 4.8 oz (86.8 kg)    Fetal Status: Fetal Heart Rate (bpm): 133   Movement: Present     General:  Alert, oriented and cooperative. Patient is in no acute distress.  Skin: Skin is warm and dry. No rash noted.   Cardiovascular: Normal heart rate noted  Respiratory: Normal respiratory effort, no problems with respiration noted  Abdomen: Soft, gravid, appropriate for gestational age. Pain/Pressure: Present     Pelvic:  Cervical exam deferred        Extremities: Normal range of motion.  Edema: None  Mental Status: Normal mood and affect. Normal behavior. Normal judgment and thought content.   Urinalysis:      Assessment and Plan:  Pregnancy: [id number] at [redacted]w[redacted]d  1. Multigravida of advanced maternal age in third trimester Neg mat21  2. Placenta previa in third trimester 5/21 [place] u/s showed persistent previa. D/w her that will get 36wk u/s and if persistent then  would need c/s at 37wks. ED precautions given. Pt desires btl if for c-section. R/b/a d/w her - [place] MFM OB LIMITED; Future - [place] MFM OB Transvaginal; Future  3. Supervision of high risk pregnancy, antepartum Neg 28wk labs.  - [place] MFM OB LIMITED; Future - [place] MFM OB Transvaginal; Future  Interpreter used  Preterm labor symptoms and general obstetric precautions including but not limited to vaginal bleeding, contractions, leaking of fluid and fetal movement were reviewed in detail with the patient. Please refer to After Visit Summary for other counseling recommendations.  Return in about 2 weeks (around 12/14/2018) for hrob, in person.   [place] Bing, MD

## 2018-12-10 ENCOUNTER — Telehealth: Payer: Self-pay | Admitting: Family Medicine

## 2018-12-10 NOTE — Telephone Encounter (Signed)
Called patient with interpreter Raquel, patient is aware to download the [facility] webex meetings app.

## 2018-12-14 ENCOUNTER — Ambulatory Visit (INDEPENDENT_AMBULATORY_CARE_PROVIDER_SITE_OTHER): Payer: Self-pay | Admitting: Obstetrics & Gynecology

## 2018-12-14 ENCOUNTER — Other Ambulatory Visit: Payer: Self-pay

## 2018-12-14 ENCOUNTER — Telehealth: Payer: Self-pay | Admitting: Obstetrics & Gynecology

## 2018-12-14 VITALS — BP 113/76 | HR 83

## 2018-12-14 DIAGNOSIS — O4403 Placenta previa specified as without hemorrhage, third trimester: Secondary | ICD-10-CM

## 2018-12-14 DIAGNOSIS — Z3A32 32 weeks gestation of pregnancy: Secondary | ICD-10-CM

## 2018-12-14 DIAGNOSIS — O0990 Supervision of high risk pregnancy, unspecified, unspecified trimester: Secondary | ICD-10-CM

## 2018-12-14 NOTE — Progress Notes (Signed)
Interpreter [phone] used to call pt regarding her appointment.   I connected with  Colleen Blanchard on 12/14/18 at  3:55 PM EDT by telephone and verified that I am speaking with the correct person using two identifiers.   I discussed the limitations, risks, security and privacy concerns of performing an evaluation and management service by telephone and the availability of in person appointments. I also discussed with the patient that there may be a patient responsible charge related to this service. The patient expressed understanding and agreed to proceed.  Dolores Hoose, RN 12/14/2018  3:51 PM

## 2018-12-14 NOTE — Progress Notes (Signed)
   TELEHEALTH VIRTUAL OBSTETRICS VISIT ENCOUNTER NOTE  I connected with Colleen Blanchard on 12/14/18 at  3:55 PM EDT by telephone at home and verified that I am speaking with the correct person using two identifiers.   I discussed the limitations, risks, security and privacy concerns of performing an evaluation and management service by telephone and the availability of in person appointments. I also discussed with the patient that there may be a patient responsible charge related to this service. The patient expressed understanding and agreed to proceed.  Subjective:  Colleen Blanchard is a 38 y.o. [phone] at 101w0d being followed for ongoing prenatal care.  She is currently monitored for the following issues for this high-risk pregnancy and has Abnormal glucose affecting pregnancy; AMA (advanced maternal age) multigravida 35+; Placenta previa; Supervision of high risk pregnancy, antepartum; GBS (group B streptococcus) UTI complicating pregnancy; and Language barrier on their problem list.  Patient reports no complaints. Reports fetal movement. Denies any contractions, bleeding or leaking of fluid.   The following portions of the patient's history were reviewed and updated as appropriate: allergies, current medications, past family history, past medical history, past social history, past surgical history and problem list.   Objective:   General:  Alert, oriented and cooperative.   Mental Status: Normal mood and affect perceived. Normal judgment and thought content.  Rest of physical exam deferred due to type of encounter  Assessment and Plan:  Pregnancy: [id number] at [redacted]w[redacted]d 1. Supervision of high risk pregnancy, antepartum transerred from HD, [facility]  2. Placenta previa in third trimester [place] f/u at HD. Cesarean section 37 weeks  Preterm labor symptoms and general obstetric precautions including but not limited to vaginal bleeding, contractions, leaking of fluid and fetal movement  were reviewed in detail with the patient.  I discussed the assessment and treatment plan with the patient. The patient was provided an opportunity to ask questions and all were answered. The patient agreed with the plan and demonstrated an understanding of the instructions. The patient was advised to call back or seek an in-person office evaluation/go to MAU at [facility] for any urgent or concerning symptoms. Please refer to After Visit Summary for other counseling recommendations.   I provided 15 minutes of non-face-to-face time during this encounter.  Return in about 2 weeks (around 12/28/2018) for virtual.  Future Appointments  Date Time Provider [place]  01/11/2019  1:15 PM Woodroe Mode, MD [facility]    Emeterio Reeve, [place] [facility], [place]

## 2018-12-14 NOTE — Patient Instructions (Signed)
Parto por cesárea °Cesarean Delivery °El nacimiento o parto por cesárea es el parto quirúrgico de un bebé a través de una incisión en el abdomen y el útero. Se lo conoce como cesárea. Este procedimiento se puede programar con anticipación o se puede realizar en una situación de emergencia. °Informe al médico acerca de lo siguiente: °· Cualquier alergia que tenga. °· Todos los medicamentos que utiliza, incluidos vitaminas, hierbas, gotas oftálmicas, cremas y medicamentos de venta libre. °· Cualquier problema previo que usted o algún miembro de su familia haya tenido con los anestésicos. °· Cualquier trastorno de la sangre que tenga. °· Cirugías a las que se haya sometido. °· Cualquier afección médica que tenga. °· Si usted o algún familiar tiene antecedentes de trombosis venosa profunda (TVP) o embolia pulmonar (EP). °¿Cuáles son los riesgos? °En general, se trata de un procedimiento seguro. Sin embargo, pueden ocurrir complicaciones, por ejemplo: °· Infección. °· Sangrado. °· Reacciones alérgicas a los medicamentos. °· Daños a otras estructuras u órganos. °· Coágulos de sangre. °· Lesiones al bebé. °¿Qué ocurre antes del procedimiento? °Indicaciones generales °· Siga las indicaciones del médico respecto de las restricciones en las comidas o las bebidas. °· Si sabe que va a tener un parto por cesárea, no se rasure la zona púbica. Rasurarse antes del procedimiento puede aumentar el riesgo de infección. °· Pídale a alguien que la lleve a su casa desde el hospital. °· Pregúntele al médico qué medidas se tomarán para prevenir una infección. Estas pueden incluir: °? Rasurar el vello del lugar de la cirugía. °? Lavar la piel con un jabón antiséptico. °? Suministrar antibióticos. °· Según el motivo del parto por cesárea, es posible que se le realice un examen físico o una prueba adicional, como una ecografía. °· Posiblemente le realicen un análisis de sangre u orina. °Preguntas para hacerle al médico °· Consulte al médico  sobre: °? Cambiar o suspender los medicamentos que toma habitualmente. Esto es muy importante si toma medicamentos para la diabetes o anticoagulantes. °? El plan para controlar el dolor. Esto es muy importante si piensa amamantar a su bebé. °? Cuánto tiempo permanecerá en el hospital después del procedimiento. °? Cualquier inquietud que pueda tener sobre recibir hemoderivados, si los necesita durante el procedimiento. °? Almacenamiento de sangre del cordón, si planea guardar la sangre del cordón umbilical del bebé. °· Quizá también desee preguntarle al médico lo siguiente: °? Si va a poder sostener o amamantar a su bebé mientras aún se encuentre en el quirófano. °? Si su bebé puede quedarse con usted inmediatamente después del procedimiento y durante su recuperación. °? Si un familiar o la persona que usted elija puede ingresar al quirófano y permanecer con usted durante el procedimiento, inmediatamente después de este y durante su recuperación. °¿Qué ocurre durante el procedimiento? ° °· Le colocarán una vía intravenosa en una vena. °· Le administrarán líquido y medicamentos, tales como antibióticos, antes de la cirugía. °· Le colocarán monitores fetales en el abdomen para controlar la frecuencia cardíaca de su bebé. °· Se le puede entregar una bata especial de calentamiento para mantener estable la temperatura corporal. °· Se le puede insertar un catéter en la vejiga a través de la uretra. Esto se hace para drenar la orina durante el procedimiento. °· Pueden administrarle uno o más de los siguientes medicamentos: °? Un medicamento para adormecer la zona (anestesia local). °? Un medicamento que la hará dormir (anestesia general). °? Un medicamento (anestesia regional) que se le inyecta en la espalda o a   través de un tubo pequeño y delgado que se le coloca en la espalda (anestesia raquídea o anestesia epidural). Esto adormece la parte del cuerpo que está por debajo del sitio de la inyección y le permite permanecer  despierta durante el procedimiento. Si llega a sentir náuseas, dígaselo al médico. Tendrá medicamentos disponibles para ayudarla a reducir cualquier náusea que pueda sentir. °· Le harán una incisión en el abdomen y luego en el útero. °· Si está despierta durante el procedimiento, puede sentir tirones en el abdomen, pero no debería sentir dolor. Si siente dolor, dígaselo al médico inmediatamente. °· Se sacará al bebé del útero. Es posible que sienta más presión o un tirón, mientras esto sucede. °· Inmediatamente después del nacimiento, se secará al bebé y se lo mantendrá caliente. Podrá sostener y amamantar a su bebé. °· Durante este momento, es posible que se pince y corte el cordón umbilical. Esto ocurre por lo general después de un período de 1 a 2 minutos después del parto. °· Se le extraerá la placenta del útero. °· Las incisiones se cerrarán con puntos (suturas). Es posible que se apliquen grapas, goma para cerrar la piel o tiras adhesivas en la incisión del abdomen. °· Pueden colocarle vendas (vendajes) sobre la incisión del abdomen. °El procedimiento puede variar según el médico y el hospital. °¿Qué ocurre después del procedimiento? °· Le controlarán la presión arterial, la frecuencia cardíaca, la frecuencia respiratoria y el nivel de oxígeno en la sangre hasta que le den el alta del hospital. °· Puede seguir recibiendo líquidos o medicamentos por vía intravenosa. °· Sentirá un poco de dolor. Tendrá analgésicos disponibles para ayudarla a controlar el dolor. °· Para evitar la formación de coágulos de sangre: °? Se le pueden administrar medicamentos. °? Quizás deba usar medias o dispositivos de compresión. °? Se le indicará que camine cuando pueda hacerlo. °· El personal del hospital alentará y apoyará que cree lazos con su bebé. En el hospital, es posible que le permitan que el bebé permanezca en la misma habitación que usted (internación conjunta) durante la hospitalización para fomentar la creación del  vínculo y un amamantamiento exitoso. °· Se le puede sugerir que tosa y respire profundamente con frecuencia. Esto ayuda a evitar problemas pulmonares. °· Si tiene un catéter que le drena la orina, se le quitará lo antes posible después del procedimiento. °Resumen °· El nacimiento o parto por cesárea es el parto quirúrgico de un bebé a través de una incisión en el abdomen y el útero. °· Siga las indicaciones del médico con respecto a las restricciones de comidas o bebidas antes del procedimiento. °· Sentirá algo de dolor después del procedimiento. Tendrá analgésicos disponibles para ayudarla a controlar el dolor. °· El personal del hospital alentará y apoyará que cree lazos con su bebé después del procedimiento. En el hospital, es posible que le permitan que el bebé permanezca en la misma habitación que usted (internación conjunta) durante la hospitalización para fomentar la creación del vínculo y un amamantamiento exitoso. °Esta información no tiene como fin reemplazar el consejo del médico. Asegúrese de hacerle al médico cualquier pregunta que tenga. °Document Released: 06/17/2005 Document Revised: 01/29/2018 Document Reviewed: 01/29/2018 °Elsevier Interactive Patient Education © 2019 [employer]vier Inc. ° °

## 2018-12-14 NOTE — Telephone Encounter (Signed)
Patient was called and given the information for her visit on 12/14/2018. °

## 2018-12-23 ENCOUNTER — Encounter: Payer: Self-pay | Admitting: *Deleted

## 2019-01-05 ENCOUNTER — Encounter: Payer: Self-pay | Admitting: *Deleted

## 2019-01-06 NOTE — Pre-Procedure Instructions (Signed)
Interpreter number [phone]

## 2019-01-07 ENCOUNTER — Encounter (HOSPITAL_COMMUNITY): Payer: Self-pay

## 2019-01-07 NOTE — Patient Instructions (Signed)
Instrucciones Para Antes de la Ciruga   Su ciruga est programada para 7.20.2020  (your procedure is scheduled on) Entre por la entrada [facility]  a [place] -(enter through the main entrance at [facility] at 1000 AM       Por favor llame al [phone] si tiene algn problema en la maana de la ciruga (please call this number if you have any problems the morning of surgery.)                  Recuerde: (Remember)  No coma alimentos. (Do not eat food (After Midnight) Desps de medianoche)    No tome lquidos claros. (Do not drink clear liquids (After Midnight) Desps de medianoche)    No use joyas, maquillaje de ojos, lpiz labial, crema para el cuerpo o esmalte de uas oscuro. (Do not wear jewelry, eye makeup, lipstick, body lotion, or dark fingernail polish). Puede usar desodorante (you may wear deodorant)    No se afeite 48 horas de su ciruga. (Do not shave 48 hours before your surgery)    No traiga objetos de valor al hospital.  Penuelas no se hace responsable de ninguna pertenencia, ni objetos de valor que haya trado al hospital. (Do not bring valuable to the hospital.  [facility] is not responsible for any belongings or valuables brought to the hospital)   [facility] medicinas en la maana de la ciruga con un SORBITO de agua nada (take these meds the morning of surgery with a SIP of water)     Durante la ciruga no se pueden usar lentes de contacto, dentaduras o puentes. (Contacts, dentures or bridgework cannot be worn in surgery).   Si va a ser ingresado despus de la ciruga, deje la [employer] en el carro hasta que se le haya asignado una habitacin. (If you are to be admitted after surgery, leave suitcase in car until your room has been assigned.)   A los pacientes que se les d de alta el mismo da no se les permitir manejar a casa.  (Patients discharged on the day of surgery will not be  allowed to drive home)    [place] n[place] de telfono del Teacher, adult education na. (Name and telephone number of your driver)   Instrucciones especiales N/A (Special Instructions)   Por favor, lea las hojas informativas que le entregaron. (Please read over the following fact sheets that you were given) Surgical Site Infection Prevention

## 2019-01-07 NOTE — Pre-Procedure Instructions (Signed)
Interpreter number [phone]

## 2019-01-11 ENCOUNTER — Ambulatory Visit (INDEPENDENT_AMBULATORY_CARE_PROVIDER_SITE_OTHER): Payer: Self-pay | Admitting: Obstetrics & Gynecology

## 2019-01-11 ENCOUNTER — Other Ambulatory Visit: Payer: Self-pay

## 2019-01-11 VITALS — BP 121/84 | HR 98

## 2019-01-11 DIAGNOSIS — O0990 Supervision of high risk pregnancy, unspecified, unspecified trimester: Secondary | ICD-10-CM

## 2019-01-11 DIAGNOSIS — Z3A36 36 weeks gestation of pregnancy: Secondary | ICD-10-CM

## 2019-01-11 DIAGNOSIS — O0993 Supervision of high risk pregnancy, unspecified, third trimester: Secondary | ICD-10-CM

## 2019-01-11 NOTE — Progress Notes (Signed)
1:14 I called Verdis Frederickson with Interpreter [facility] Golden and left  A message we are calling for your virtual visit and will call back in a few minutes.  Valbona Slabach,RN   1:26p: Pt answered.

## 2019-01-11 NOTE — Progress Notes (Signed)
    TELEHEALTH VIRTUAL OBSTETRICS VISIT ENCOUNTER NOTE  I connected with Colleen Blanchard on 01/11/19 at  1:15 PM EDT by telephone at home and verified that I am speaking with the correct person using two identifiers.   I discussed the limitations, risks, security and privacy concerns of performing an evaluation and management service by telephone and the availability of in person appointments. I also discussed with the patient that there may be a patient responsible charge related to this service. The patient expressed understanding and agreed to proceed.  Subjective:  Colleen Blanchard is a 38 y.o. [phone] at [redacted]w[redacted]d being followed for ongoing prenatal care.  She is currently monitored for the following issues for this high-risk pregnancy and has Abnormal glucose affecting pregnancy; AMA (advanced maternal age) multigravida 35+; Placenta previa; Supervision of high risk pregnancy, antepartum; GBS (group B streptococcus) UTI complicating pregnancy; and Language barrier on their problem list.  Patient reports no complaints. Reports fetal movement. Denies any contractions, bleeding or leaking of fluid.   The following portions of the patient's history were reviewed and updated as appropriate: allergies, current medications, past family history, past medical history, past social history, past surgical history and problem list.   Objective:   General:  Alert, oriented and cooperative.   Mental Status: Normal mood and affect perceived. Normal judgment and thought content.  Rest of physical exam deferred due to type of encounter  Assessment and Plan:  Pregnancy: G5P3013 at [redacted]w[redacted]d 1. Supervision of high risk pregnancy, antepartum Placenta previa resolved on [place] 12/24/18   symptoms and general obstetric precautions including but not limited to vaginal bleeding, contractions, leaking of fluid and fetal movement were reviewed in detail with the patient.  I discussed the assessment and treatment  plan with the patient. The patient was provided an opportunity to ask questions and all were answered. The patient agreed with the plan and demonstrated an understanding of the instructions. The patient was advised to call back or seek an in-person office evaluation/go to MAU at [facility] for any urgent or concerning symptoms. Please refer to After Visit Summary for other counseling recommendations.   I provided 12 minutes of non-face-to-face time during this encounter.  Return in about 1 week (around 01/18/2019) for in person.  Future Appointments  Date Time Provider [place]  01/14/2019  7:50 AM MC-MAU 1 MC-INDC None    Emeterio Reeve, [place] for [place]

## 2019-01-11 NOTE — Patient Instructions (Signed)

## 2019-01-13 ENCOUNTER — Telehealth: Payer: Self-pay | Admitting: Obstetrics & Gynecology

## 2019-01-13 NOTE — Telephone Encounter (Signed)
The patient stated she did not receive a call back about her appointment. Informed the patient of the updated appointment information.   Also blocked the second half of the 9:15 slot as she speaks spanish and no option schedule the patient for the week she is requested to be seen in office.

## 2019-01-14 ENCOUNTER — Other Ambulatory Visit (HOSPITAL_COMMUNITY)
Admission: RE | Admit: 2019-01-14 | Discharge: 2019-01-14 | Disposition: A | Discharge status: Home / Self Care | Payer: Self-pay | Source: Ambulatory Visit

## 2019-01-18 ENCOUNTER — Inpatient Hospital Stay (HOSPITAL_COMMUNITY): Admit: 2019-01-18 | Payer: Self-pay | Admitting: Obstetrics and Gynecology

## 2019-01-18 ENCOUNTER — Encounter (HOSPITAL_COMMUNITY): Payer: Self-pay

## 2019-01-18 SURGERY — Surgical Case
Anesthesia: Regional

## 2019-01-19 ENCOUNTER — Telehealth: Payer: Self-pay | Admitting: Obstetrics & Gynecology

## 2019-01-19 NOTE — Telephone Encounter (Signed)
Spoke to patient w/ Colleen Blanchard Spanish interpreter about her appointment on 7/22 @ 9:15. Patient instructed to wear a face mask for the entire appointment and no visitors are allowed with her during the visit. Patient screened for covid symptoms and denied having any

## 2019-01-20 ENCOUNTER — Ambulatory Visit (INDEPENDENT_AMBULATORY_CARE_PROVIDER_SITE_OTHER): Payer: Self-pay | Admitting: Obstetrics & Gynecology

## 2019-01-20 ENCOUNTER — Other Ambulatory Visit: Payer: Self-pay

## 2019-01-20 VITALS — BP 124/81 | HR 93 | Wt 195.3 lb

## 2019-01-20 DIAGNOSIS — O0990 Supervision of high risk pregnancy, unspecified, unspecified trimester: Secondary | ICD-10-CM

## 2019-01-20 DIAGNOSIS — Z3A37 37 weeks gestation of pregnancy: Secondary | ICD-10-CM

## 2019-01-20 DIAGNOSIS — O09523 Supervision of elderly multigravida, third trimester: Secondary | ICD-10-CM

## 2019-01-20 DIAGNOSIS — Z113 Encounter for screening for infections with a predominantly sexual mode of transmission: Secondary | ICD-10-CM

## 2019-01-20 DIAGNOSIS — O2343 Unspecified infection of urinary tract in pregnancy, third trimester: Secondary | ICD-10-CM

## 2019-01-20 DIAGNOSIS — B951 Streptococcus, group B, as the cause of diseases classified elsewhere: Secondary | ICD-10-CM

## 2019-01-20 DIAGNOSIS — O0993 Supervision of high risk pregnancy, unspecified, third trimester: Secondary | ICD-10-CM

## 2019-01-20 DIAGNOSIS — Z789 Other specified health status: Secondary | ICD-10-CM

## 2019-01-20 LAB — OB RESULTS CONSOLE GBS: GBS: POSITIVE

## 2019-01-20 NOTE — Progress Notes (Signed)
   PRENATAL VISIT NOTE  Subjective:  Colleen Blanchard is a 38 y.o. [phone] at [redacted]w[redacted]d being seen today for ongoing prenatal care.  She is currently monitored for the following issues for this low-risk pregnancy and has Abnormal glucose affecting pregnancy; AMA (advanced maternal age) multigravida 35+; Placenta previa; Supervision of high risk pregnancy, antepartum; GBS (group B streptococcus) UTI complicating pregnancy; and Language barrier on their problem list.  Patient reports no complaints.  Contractions: Irritability. Vag. Bleeding: None.  Movement: Present. Denies leaking of fluid.   The following portions of the patient's history were reviewed and updated as appropriate: allergies, current medications, past family history, past medical history, past social history, past surgical history and problem list.   Objective:   Vitals:   01/20/19 0922  BP: 124/81  Pulse: 93  Weight: 195 lb 4.8 oz (88.6 kg)    Fetal Status: Fetal Heart Rate (bpm): 152   Movement: Present     General:  Alert, oriented and cooperative. Patient is in no acute distress.  Skin: Skin is warm and dry. No rash noted.   Cardiovascular: Normal heart rate noted  Respiratory: Normal respiratory effort, no problems with respiration noted  Abdomen: Soft, gravid, appropriate for gestational age.  Pain/Pressure: Present     Pelvic: Cervical exam performed        Extremities: Normal range of motion.  Edema: None  Mental Status: Normal mood and affect. Normal behavior. Normal judgment and thought content.   Assessment and Plan:  Pregnancy: A2N0539 at [redacted]w[redacted]d 1. Supervision of high risk pregnancy, antepartum - Culture, beta strep (group b only) - GC/Chlamydia probe amp ([place])not at [facility]  2. Language barrier - iPad interpretor  3. Group B Streptococcus urinary tract infection affecting pregnancy, antepartum - treat in labor  4. Multigravida of advanced maternal age in third trimester   Preterm labor  symptoms and general obstetric precautions including but not limited to vaginal bleeding, contractions, leaking of fluid and fetal movement were reviewed in detail with the patient. Please refer to After Visit Summary for other counseling recommendations.   No follow-ups on file.  No future appointments.  Emily Filbert, MD

## 2019-01-21 LAB — GC/CHLAMYDIA PROBE AMP (~~LOC~~) NOT AT ARMC
Chlamydia: NEGATIVE
Neisseria Gonorrhea: NEGATIVE

## 2019-01-23 LAB — CULTURE, BETA STREP (GROUP B ONLY): Strep Gp B Culture: POSITIVE — AB

## 2019-01-28 ENCOUNTER — Ambulatory Visit (INDEPENDENT_AMBULATORY_CARE_PROVIDER_SITE_OTHER): Payer: Self-pay | Admitting: Obstetrics & Gynecology

## 2019-01-28 ENCOUNTER — Other Ambulatory Visit: Payer: Self-pay

## 2019-01-28 VITALS — BP 132/84 | HR 104

## 2019-01-28 DIAGNOSIS — O09529 Supervision of elderly multigravida, unspecified trimester: Secondary | ICD-10-CM

## 2019-01-28 DIAGNOSIS — B951 Streptococcus, group B, as the cause of diseases classified elsewhere: Secondary | ICD-10-CM

## 2019-01-28 DIAGNOSIS — O4403 Placenta previa specified as without hemorrhage, third trimester: Secondary | ICD-10-CM

## 2019-01-28 DIAGNOSIS — O2343 Unspecified infection of urinary tract in pregnancy, third trimester: Secondary | ICD-10-CM

## 2019-01-28 DIAGNOSIS — Z3A38 38 weeks gestation of pregnancy: Secondary | ICD-10-CM

## 2019-01-28 DIAGNOSIS — O09523 Supervision of elderly multigravida, third trimester: Secondary | ICD-10-CM

## 2019-01-28 DIAGNOSIS — Z789 Other specified health status: Secondary | ICD-10-CM

## 2019-01-28 DIAGNOSIS — O0993 Supervision of high risk pregnancy, unspecified, third trimester: Secondary | ICD-10-CM

## 2019-01-28 DIAGNOSIS — O4400 Placenta previa specified as without hemorrhage, unspecified trimester: Secondary | ICD-10-CM

## 2019-01-28 DIAGNOSIS — O0990 Supervision of high risk pregnancy, unspecified, unspecified trimester: Secondary | ICD-10-CM

## 2019-01-28 DIAGNOSIS — O2340 Unspecified infection of urinary tract in pregnancy, unspecified trimester: Secondary | ICD-10-CM

## 2019-01-28 DIAGNOSIS — O99810 Abnormal glucose complicating pregnancy: Secondary | ICD-10-CM

## 2019-01-28 NOTE — Progress Notes (Signed)
   TELEHEALTH OBSTETRICS PRENATAL VIRTUAL VIDEO VISIT ENCOUNTER NOTE  Provider location: Center for [employer] at [facility]   I connected with Colleen Blanchard on 01/28/19 at  3:15 PM EDT by WebEx Video Encounter at home and verified that I am speaking with the correct person using two identifiers.   I discussed the limitations, risks, security and privacy concerns of performing an evaluation and management service virtually and the availability of in person appointments. I also discussed with the patient that there may be a patient responsible charge related to this service. The patient expressed understanding and agreed to proceed. Subjective:  Colleen Blanchard is a 38 y.o. [phone] at [redacted]w[redacted]d being seen today for ongoing prenatal care.  She is currently monitored for the following issues for this high-risk pregnancy and has Abnormal glucose affecting pregnancy; AMA (advanced maternal age) multigravida 35+; Placenta previa; Supervision of high risk pregnancy, antepartum; GBS (group B streptococcus) UTI complicating pregnancy; and Language barrier on their problem list.  Patient reports no complaints.  Contractions: Not present. Vag. Bleeding: None.  Movement: Present. Denies any leaking of fluid.   The following portions of the patient's history were reviewed and updated as appropriate: allergies, current medications, past family history, past medical history, past social history, past surgical history and problem list.   Objective:   Vitals:   01/28/19 1427  BP: 132/84  Pulse: (!) 104    Fetal Status:     Movement: Present     General:  Alert, oriented and cooperative. Patient is in no acute distress.  Respiratory: Normal respiratory effort, no problems with respiration noted  Mental Status: Normal mood and affect. Normal behavior. Normal judgment and thought content.  Rest of physical exam deferred due to type of encounter  Imaging: No results found.  Assessment and  Plan:  Pregnancy: [id number] at [redacted]w[redacted]d 1. Supervision of high risk pregnancy, antepartum Good FM  BP WNL  2. Placenta previa antepartum resolved  3. Language barrier Spanish interpreter used.   4. Group B Streptococcus urinary tract infection affecting pregnancy, antepartum Need atbx in labor.    5. Antepartum multigravida of advanced maternal age   55. Abnormal glucose affecting pregnancy GTT WNL  Term labor symptoms and general obstetric precautions including but not limited to vaginal bleeding, contractions, leaking of fluid and fetal movement were reviewed in detail with the patient. I discussed the assessment and treatment plan with the patient. The patient was provided an opportunity to ask questions and all were answered. The patient agreed with the plan and demonstrated an understanding of the instructions. The patient was advised to call back or seek an in-person office evaluation/go to MAU at [facility] for any urgent or concerning symptoms. Please refer to After Visit Summary for other counseling recommendations.   I provided 8 minutes of face-to-face time during this encounter.  No follow-ups on file.  Future Appointments  Date Time Provider [place]  01/28/2019  3:15 PM Lavonia Drafts, MD [facility] [place]  02/02/2019 10:55 AM Sloan Leiter, MD [facility] [place]  02/10/2019  2:35 PM Emily Filbert, MD WOC-WOCA [place]  02/10/2019  3:15 PM WOC-WOCA NST WOC-WOCA WOC    Lavonia Drafts, MD Center for [employer], [place] [facility]

## 2019-01-28 NOTE — Progress Notes (Signed)
I connected with  Colleen Blanchard on 01/28/19 at  3:15 PM EDT by telephone and verified that I am speaking with the correct person using two identifiers.   I discussed the limitations, risks, security and privacy concerns of performing an evaluation and management service by telephone and virtually and the availability of in person appointments. I also discussed with the patient that there may be a patient responsible charge related to this service. The patient expressed understanding and agreed to proceed.  Linda,RN 01/28/2019  2:24 PM

## 2019-02-02 ENCOUNTER — Ambulatory Visit (INDEPENDENT_AMBULATORY_CARE_PROVIDER_SITE_OTHER): Payer: Self-pay | Admitting: Obstetrics and Gynecology

## 2019-02-02 ENCOUNTER — Other Ambulatory Visit: Payer: Self-pay

## 2019-02-02 VITALS — BP 124/80 | HR 88

## 2019-02-02 DIAGNOSIS — O09523 Supervision of elderly multigravida, third trimester: Secondary | ICD-10-CM

## 2019-02-02 DIAGNOSIS — O09529 Supervision of elderly multigravida, unspecified trimester: Secondary | ICD-10-CM

## 2019-02-02 DIAGNOSIS — O2343 Unspecified infection of urinary tract in pregnancy, third trimester: Secondary | ICD-10-CM

## 2019-02-02 DIAGNOSIS — O4403 Placenta previa specified as without hemorrhage, third trimester: Secondary | ICD-10-CM

## 2019-02-02 DIAGNOSIS — O0993 Supervision of high risk pregnancy, unspecified, third trimester: Secondary | ICD-10-CM

## 2019-02-02 DIAGNOSIS — B951 Streptococcus, group B, as the cause of diseases classified elsewhere: Secondary | ICD-10-CM

## 2019-02-02 DIAGNOSIS — O0990 Supervision of high risk pregnancy, unspecified, unspecified trimester: Secondary | ICD-10-CM

## 2019-02-02 DIAGNOSIS — O2340 Unspecified infection of urinary tract in pregnancy, unspecified trimester: Secondary | ICD-10-CM

## 2019-02-02 DIAGNOSIS — O99820 Streptococcus B carrier state complicating pregnancy: Secondary | ICD-10-CM

## 2019-02-02 DIAGNOSIS — Z3A39 39 weeks gestation of pregnancy: Secondary | ICD-10-CM

## 2019-02-02 DIAGNOSIS — O4400 Placenta previa specified as without hemorrhage, unspecified trimester: Secondary | ICD-10-CM

## 2019-02-02 DIAGNOSIS — Z789 Other specified health status: Secondary | ICD-10-CM

## 2019-02-02 NOTE — Progress Notes (Signed)
   TELEHEALTH OBSTETRICS PRENATAL VIRTUAL VIDEO VISIT ENCOUNTER NOTE  Provider location: Center for [employer] at [facility]   I connected with Colleen Blanchard on 02/02/19 at 10:55 AM EDT by MyChart Video Encounter at home and verified that I am speaking with the correct person using two identifiers.   I discussed the limitations, risks, security and privacy concerns of performing an evaluation and management service virtually and the availability of in person appointments. I also discussed with the patient that there may be a patient responsible charge related to this service. The patient expressed understanding and agreed to proceed. Subjective:  Colleen Blanchard is a 38 y.o. [phone] at [redacted]w[redacted]d being seen today for ongoing prenatal care.  She is currently monitored for the following issues for this high-risk pregnancy and has Abnormal glucose affecting pregnancy; AMA (advanced maternal age) multigravida 35+; Placenta previa; Supervision of high risk pregnancy, antepartum; GBS (group B streptococcus) UTI complicating pregnancy; and Language barrier on their problem list.  Patient reports no complaints.  Contractions: Not present. Vag. Bleeding: None.  Movement: Present. Denies any leaking of fluid.   The following portions of the patient's history were reviewed and updated as appropriate: allergies, current medications, past family history, past medical history, past social history, past surgical history and problem list.   Objective:   Vitals:   02/02/19 1043  BP: 124/80  Pulse: 88   Fetal Status:     Movement: Present     General:  Alert, oriented and cooperative. Patient is in no acute distress.  Respiratory: Normal respiratory effort, no problems with respiration noted  Mental Status: Normal mood and affect. Normal behavior. Normal judgment and thought content.  Rest of physical exam deferred due to type of encounter  Imaging: No results found.  Assessment and Plan:   Pregnancy: [id number] at [redacted]w[redacted]d  1. Supervision of high risk pregnancy, antepartum IOL for 41 weeks, scheduled today Post dates testing starting 40 weeks  2. Antepartum multigravida of advanced maternal age  48. Language barrier Patent attorney used  4. Group B Streptococcus urinary tract infection affecting pregnancy, antepartum ppx in labor  5. Placenta previa antepartum resolved  Term labor symptoms and general obstetric precautions including but not limited to vaginal bleeding, contractions, leaking of fluid and fetal movement were reviewed in detail with the patient. I discussed the assessment and treatment plan with the patient. The patient was provided an opportunity to ask questions and all were answered. The patient agreed with the plan and demonstrated an understanding of the instructions. The patient was advised to call back or seek an in-person office evaluation/go to MAU at [facility] for any urgent or concerning symptoms. Please refer to After Visit Summary for other counseling recommendations.   I provided 15 minutes of face-to-face time during this encounter.  Return in about 1 week (around 02/09/2019) for OB visit, in person, keep previously scheduled visit.  Future Appointments  Date Time Provider [place]  02/10/2019  2:35 PM Emily Filbert, MD [facility] [place]  02/10/2019  3:15 PM WOC-WOCA NST [place]    Sloan Leiter, [place] for [employer], [place]

## 2019-02-02 NOTE — Progress Notes (Addendum)
I connected with  Colleen Blanchard on 02/02/19 at 10:55 AM EDT by telephone and verified that I am speaking with the correct person using two identifiers.   I discussed the limitations, risks, security and privacy concerns of performing an evaluation and management service by telephone and the availability of in person appointments. I also discussed with the patient that there may be a patient responsible charge related to this service. The patient expressed understanding and agreed to proceed.  With Spanish Interpreter Eda R., scheduled IOL for August 17th @ 0700.  Pt notified.   Verdell Carmine, RN 02/02/2019  10:41 AM

## 2019-02-04 ENCOUNTER — Telehealth (HOSPITAL_COMMUNITY): Payer: Self-pay | Admitting: *Deleted

## 2019-02-04 ENCOUNTER — Encounter (HOSPITAL_COMMUNITY): Payer: Self-pay | Admitting: *Deleted

## 2019-02-04 NOTE — Telephone Encounter (Signed)
Preadmission screen Interpreter number [phone]

## 2019-02-09 ENCOUNTER — Other Ambulatory Visit: Payer: Self-pay | Admitting: Advanced Practice Midwife

## 2019-02-10 ENCOUNTER — Other Ambulatory Visit: Payer: Self-pay

## 2019-02-10 ENCOUNTER — Ambulatory Visit (INDEPENDENT_AMBULATORY_CARE_PROVIDER_SITE_OTHER): Payer: Self-pay | Admitting: Obstetrics & Gynecology

## 2019-02-10 ENCOUNTER — Ambulatory Visit (INDEPENDENT_AMBULATORY_CARE_PROVIDER_SITE_OTHER): Payer: Self-pay | Admitting: General Practice

## 2019-02-10 ENCOUNTER — Ambulatory Visit: Payer: Self-pay

## 2019-02-10 VITALS — BP 116/70 | HR 97 | Wt 198.0 lb

## 2019-02-10 DIAGNOSIS — O0990 Supervision of high risk pregnancy, unspecified, unspecified trimester: Secondary | ICD-10-CM

## 2019-02-10 DIAGNOSIS — O09523 Supervision of elderly multigravida, third trimester: Secondary | ICD-10-CM

## 2019-02-10 DIAGNOSIS — O2340 Unspecified infection of urinary tract in pregnancy, unspecified trimester: Secondary | ICD-10-CM

## 2019-02-10 DIAGNOSIS — Z3A40 40 weeks gestation of pregnancy: Secondary | ICD-10-CM

## 2019-02-10 DIAGNOSIS — O0993 Supervision of high risk pregnancy, unspecified, third trimester: Secondary | ICD-10-CM

## 2019-02-10 DIAGNOSIS — B951 Streptococcus, group B, as the cause of diseases classified elsewhere: Secondary | ICD-10-CM

## 2019-02-10 DIAGNOSIS — Z789 Other specified health status: Secondary | ICD-10-CM

## 2019-02-10 DIAGNOSIS — O99810 Abnormal glucose complicating pregnancy: Secondary | ICD-10-CM

## 2019-02-10 DIAGNOSIS — O4400 Placenta previa specified as without hemorrhage, unspecified trimester: Secondary | ICD-10-CM

## 2019-02-10 DIAGNOSIS — O2343 Unspecified infection of urinary tract in pregnancy, third trimester: Secondary | ICD-10-CM

## 2019-02-10 DIAGNOSIS — O4403 Placenta previa specified as without hemorrhage, third trimester: Secondary | ICD-10-CM

## 2019-02-10 NOTE — Progress Notes (Signed)
3

## 2019-02-10 NOTE — Progress Notes (Signed)
   PRENATAL VISIT NOTE  Subjective:  Colleen Blanchard is a 38 y.o. [phone] at [redacted]w[redacted]d being seen today for ongoing prenatal care.  She is currently monitored for the following issues for this high-risk pregnancy and has Abnormal glucose affecting pregnancy; AMA (advanced maternal age) multigravida 35+; Placenta previa; Supervision of high risk pregnancy, antepartum; GBS (group B streptococcus) UTI complicating pregnancy; and Language barrier on their problem list.  Patient reports no complaints.  Contractions: Irritability. Vag. Bleeding: None.  Movement: (!) Decreased. Denies leaking of fluid.   The following portions of the patient's history were reviewed and updated as appropriate: allergies, current medications, past family history, past medical history, past social history, past surgical history and problem list.   Objective:   Vitals:   02/10/19 1454  BP: 116/70  Pulse: 97  Weight: 198 lb (89.8 kg)    Fetal Status: Fetal Heart Rate (bpm): 140   Movement: (!) Decreased     General:  Alert, oriented and cooperative. Patient is in no acute distress.  Skin: Skin is warm and dry. No rash noted.   Cardiovascular: Normal heart rate noted  Respiratory: Normal respiratory effort, no problems with respiration noted  Abdomen: Soft, gravid, appropriate for gestational age.  Pain/Pressure: Present     Pelvic: Cervical exam performed        Extremities: Normal range of motion.  Edema: Trace  Mental Status: Normal mood and affect. Normal behavior. Normal judgment and thought content.   Assessment and Plan:  Pregnancy: F7T0240 at [redacted]w[redacted]d 1. Supervision of high risk pregnancy, antepartum - IOL at 41 weeks scheduled - NST today  2. Group B Streptococcus urinary tract infection affecting pregnancy, antepartum - treat in labor  3. Language barrier - Live interpretor present  4. Abnormal glucose affecting pregnancy - 3 hour GTT was normal  6. Multigravida of advanced maternal age in  third trimester   Term labor symptoms and general obstetric precautions including but not limited to vaginal bleeding, contractions, leaking of fluid and fetal movement were reviewed in detail with the patient. Please refer to After Visit Summary for other counseling recommendations.   Return in about 5 weeks (around 03/17/2019) for postpartum visit.  Future Appointments  Date Time Provider [place]  02/10/2019  3:15 PM WOC-WOCA NST WOC-WOCA WOC  02/13/2019  8:00 AM MC-MAU 1 MC-INDC None  02/15/2019  7:00 AM MC-LD SCHED ROOM MC-INDC None    Emily Filbert, MD

## 2019-02-10 NOTE — Progress Notes (Signed)
Pt informed that the ultrasound is considered a limited OB ultrasound and is not intended to be a complete ultrasound exam.  Patient also informed that the ultrasound is not being completed with the intent of assessing for fetal or placental anomalies or any pelvic abnormalities.  Explained that the purpose of today's ultrasound is to assess for  BPP, presentation and AFI.  Patient acknowledges the purpose of the exam and the limitations of the study.    Carrie H RN BSN 02/10/19   

## 2019-02-11 ENCOUNTER — Other Ambulatory Visit: Payer: Self-pay

## 2019-02-11 ENCOUNTER — Inpatient Hospital Stay (HOSPITAL_COMMUNITY)
Admission: AD | Admit: 2019-02-11 | Discharge: 2019-02-13 | DRG: 807 | Disposition: A | Discharge status: Home / Self Care | Payer: Medicaid Other | Attending: Obstetrics and Gynecology | Admitting: Obstetrics and Gynecology

## 2019-02-11 ENCOUNTER — Inpatient Hospital Stay (HOSPITAL_COMMUNITY): Payer: Medicaid Other | Admitting: Anesthesiology

## 2019-02-11 ENCOUNTER — Encounter (HOSPITAL_COMMUNITY): Payer: Self-pay

## 2019-02-11 DIAGNOSIS — B951 Streptococcus, group B, as the cause of diseases classified elsewhere: Secondary | ICD-10-CM

## 2019-02-11 DIAGNOSIS — Z3A40 40 weeks gestation of pregnancy: Secondary | ICD-10-CM | POA: Diagnosis not present

## 2019-02-11 DIAGNOSIS — O480 Post-term pregnancy: Secondary | ICD-10-CM | POA: Diagnosis present

## 2019-02-11 DIAGNOSIS — O99824 Streptococcus B carrier state complicating childbirth: Secondary | ICD-10-CM | POA: Diagnosis present

## 2019-02-11 DIAGNOSIS — O26893 Other specified pregnancy related conditions, third trimester: Secondary | ICD-10-CM | POA: Diagnosis present

## 2019-02-11 DIAGNOSIS — Z20828 Contact with and (suspected) exposure to other viral communicable diseases: Secondary | ICD-10-CM | POA: Diagnosis present

## 2019-02-11 DIAGNOSIS — O0990 Supervision of high risk pregnancy, unspecified, unspecified trimester: Secondary | ICD-10-CM

## 2019-02-11 DIAGNOSIS — O09523 Supervision of elderly multigravida, third trimester: Secondary | ICD-10-CM

## 2019-02-11 DIAGNOSIS — O2340 Unspecified infection of urinary tract in pregnancy, unspecified trimester: Secondary | ICD-10-CM

## 2019-02-11 LAB — CBC
HCT: 40 % (ref 36.0–46.0)
Hemoglobin: 13.5 g/dL (ref 12.0–15.0)
MCH: 31.8 pg (ref 26.0–34.0)
MCHC: 33.8 g/dL (ref 30.0–36.0)
MCV: 94.1 fL (ref 80.0–100.0)
Platelets: 191 10*3/uL (ref 150–400)
RBC: 4.25 MIL/uL (ref 3.87–5.11)
RDW: 12 % (ref 11.5–15.5)
WBC: 9.6 10*3/uL (ref 4.0–10.5)
nRBC: 0 % (ref 0.0–0.2)

## 2019-02-11 LAB — TYPE AND SCREEN
ABO/RH(D): O POS
Antibody Screen: NEGATIVE

## 2019-02-11 LAB — SARS CORONAVIRUS 2 BY RT PCR (HOSPITAL ORDER, PERFORMED IN ~~LOC~~ HOSPITAL LAB): SARS Coronavirus 2: NEGATIVE

## 2019-02-11 LAB — ABO/RH: ABO/RH(D): O POS

## 2019-02-11 MED ORDER — ACETAMINOPHEN 325 MG PO TABS: 650.0000 mg | ORAL_TABLET | ORAL | Status: DC | PRN

## 2019-02-11 MED ORDER — LACTATED RINGERS IV SOLN: 500.0000 mL | INTRAVENOUS | Status: DC | PRN

## 2019-02-11 MED ORDER — OXYTOCIN BOLUS FROM INFUSION: 500.0000 mL | Freq: Once | INTRAVENOUS | Status: DC

## 2019-02-11 MED ORDER — LACTATED RINGERS IV SOLN: INTRAVENOUS | Status: DC

## 2019-02-11 MED ORDER — LIDOCAINE HCL (PF) 1 % IJ SOLN: 30.0000 mL | INTRAMUSCULAR | Status: DC | PRN

## 2019-02-11 MED ORDER — BENZOCAINE-MENTHOL 20-0.5 % EX AERO
1.0000 "application " | INHALATION_SPRAY | CUTANEOUS | Status: DC | PRN
  Filled 2019-02-11: qty 56

## 2019-02-11 MED ORDER — OXYCODONE-ACETAMINOPHEN 5-325 MG PO TABS: 1.0000 | ORAL_TABLET | ORAL | Status: DC | PRN

## 2019-02-11 MED ORDER — FLEET ENEMA 7-19 GM/118ML RE ENEM: 1.0000 | ENEMA | Freq: Every day | RECTAL | Status: DC | PRN

## 2019-02-11 MED ORDER — SODIUM CHLORIDE 0.9 % IV SOLN
5.0000 10*6.[IU] | Freq: Once | INTRAVENOUS | Status: AC
  Administered 2019-02-11: 5 10*6.[IU] via INTRAVENOUS
  Filled 2019-02-11: qty 5

## 2019-02-11 MED ORDER — FENTANYL-BUPIVACAINE-NACL 0.5-0.125-0.9 MG/250ML-% EP SOLN: 12.0000 mL/h | EPIDURAL | Status: DC | PRN

## 2019-02-11 MED ORDER — ZOLPIDEM TARTRATE 5 MG PO TABS: 5.0000 mg | ORAL_TABLET | Freq: Every evening | ORAL | Status: DC | PRN

## 2019-02-11 MED ORDER — IBUPROFEN 600 MG PO TABS
600.0000 mg | ORAL_TABLET | Freq: Four times a day (QID) | ORAL | Status: DC
  Administered 2019-02-11 – 2019-02-13 (×7): 600 mg via ORAL
  Filled 2019-02-11 (×7): qty 1

## 2019-02-11 MED ORDER — OXYTOCIN 40 UNITS IN NORMAL SALINE INFUSION - SIMPLE MED
1.0000 m[IU]/min | INTRAVENOUS | Status: DC
  Administered 2019-02-11: 2 m[IU]/min via INTRAVENOUS

## 2019-02-11 MED ORDER — LIDOCAINE HCL (PF) 1 % IJ SOLN
INTRAMUSCULAR | Status: DC | PRN
  Administered 2019-02-11 (×2): 5 mL via EPIDURAL

## 2019-02-11 MED ORDER — SOD CITRATE-CITRIC ACID 500-334 MG/5ML PO SOLN: 30.0000 mL | ORAL | Status: DC | PRN

## 2019-02-11 MED ORDER — BISACODYL 10 MG RE SUPP: 10.0000 mg | Freq: Every day | RECTAL | Status: DC | PRN

## 2019-02-11 MED ORDER — EPHEDRINE 5 MG/ML INJ: 10.0000 mg | INTRAVENOUS | Status: DC | PRN

## 2019-02-11 MED ORDER — OXYTOCIN 40 UNITS IN NORMAL SALINE INFUSION - SIMPLE MED
2.5000 [IU]/h | INTRAVENOUS | Status: DC
  Filled 2019-02-11: qty 1000

## 2019-02-11 MED ORDER — METHYLERGONOVINE MALEATE 0.2 MG PO TABS: 0.2000 mg | ORAL_TABLET | ORAL | Status: DC | PRN

## 2019-02-11 MED ORDER — DIBUCAINE (PERIANAL) 1 % EX OINT: 1.0000 "application " | TOPICAL_OINTMENT | CUTANEOUS | Status: DC | PRN

## 2019-02-11 MED ORDER — MEASLES, MUMPS & RUBELLA VAC IJ SOLR: 0.5000 mL | Freq: Once | INTRAMUSCULAR | Status: DC

## 2019-02-11 MED ORDER — MISOPROSTOL 200 MCG PO TABS
800.0000 ug | ORAL_TABLET | Freq: Once | ORAL | Status: AC
  Administered 2019-02-11: 800 ug via RECTAL

## 2019-02-11 MED ORDER — ACETAMINOPHEN 325 MG PO TABS
650.0000 mg | ORAL_TABLET | ORAL | Status: DC | PRN
  Administered 2019-02-12 – 2019-02-13 (×3): 650 mg via ORAL
  Filled 2019-02-11 (×3): qty 2

## 2019-02-11 MED ORDER — OXYCODONE-ACETAMINOPHEN 5-325 MG PO TABS: 2.0000 | ORAL_TABLET | ORAL | Status: DC | PRN

## 2019-02-11 MED ORDER — WITCH HAZEL-GLYCERIN EX PADS: 1.0000 "application " | MEDICATED_PAD | CUTANEOUS | Status: DC | PRN

## 2019-02-11 MED ORDER — TERBUTALINE SULFATE 1 MG/ML IJ SOLN: 0.2500 mg | Freq: Once | INTRAMUSCULAR | Status: DC | PRN

## 2019-02-11 MED ORDER — DOCUSATE SODIUM 100 MG PO CAPS
100.0000 mg | ORAL_CAPSULE | Freq: Two times a day (BID) | ORAL | Status: DC
  Administered 2019-02-12 – 2019-02-13 (×3): 100 mg via ORAL
  Filled 2019-02-11 (×3): qty 1

## 2019-02-11 MED ORDER — SIMETHICONE 80 MG PO CHEW: 80.0000 mg | CHEWABLE_TABLET | ORAL | Status: DC | PRN

## 2019-02-11 MED ORDER — ONDANSETRON HCL 4 MG/2ML IJ SOLN: 4.0000 mg | INTRAMUSCULAR | Status: DC | PRN

## 2019-02-11 MED ORDER — FERROUS SULFATE 325 (65 FE) MG PO TABS
325.0000 mg | ORAL_TABLET | Freq: Two times a day (BID) | ORAL | Status: DC
  Administered 2019-02-12 – 2019-02-13 (×3): 325 mg via ORAL
  Filled 2019-02-11 (×3): qty 1

## 2019-02-11 MED ORDER — SODIUM CHLORIDE 0.9 % IV SOLN: 2.0000 g | Freq: Once | INTRAVENOUS | Status: DC

## 2019-02-11 MED ORDER — MISOPROSTOL 200 MCG PO TABS
ORAL_TABLET | ORAL | Status: AC
  Filled 2019-02-11: qty 4

## 2019-02-11 MED ORDER — SODIUM CHLORIDE (PF) 0.9 % IJ SOLN
INTRAMUSCULAR | Status: DC | PRN
  Administered 2019-02-11: 12 mL/h via EPIDURAL

## 2019-02-11 MED ORDER — METHYLERGONOVINE MALEATE 0.2 MG/ML IJ SOLN: 0.2000 mg | INTRAMUSCULAR | Status: DC | PRN

## 2019-02-11 MED ORDER — FENTANYL CITRATE (PF) 100 MCG/2ML IJ SOLN
50.0000 ug | Freq: Once | INTRAMUSCULAR | Status: AC
  Administered 2019-02-11: 50 ug via INTRAVENOUS

## 2019-02-11 MED ORDER — COCONUT OIL OIL: 1.0000 "application " | TOPICAL_OIL | Status: DC | PRN

## 2019-02-11 MED ORDER — FENTANYL CITRATE (PF) 100 MCG/2ML IJ SOLN
INTRAMUSCULAR | Status: AC
  Filled 2019-02-11: qty 2

## 2019-02-11 MED ORDER — ONDANSETRON HCL 4 MG PO TABS: 4.0000 mg | ORAL_TABLET | ORAL | Status: DC | PRN

## 2019-02-11 MED ORDER — OXYTOCIN 40 UNITS IN NORMAL SALINE INFUSION - SIMPLE MED: 2.5000 [IU]/h | INTRAVENOUS | Status: DC

## 2019-02-11 MED ORDER — DIPHENHYDRAMINE HCL 50 MG/ML IJ SOLN: 12.5000 mg | INTRAMUSCULAR | Status: DC | PRN

## 2019-02-11 MED ORDER — PHENYLEPHRINE 40 MCG/ML (10ML) SYRINGE FOR IV PUSH (FOR BLOOD PRESSURE SUPPORT): 80.0000 ug | PREFILLED_SYRINGE | INTRAVENOUS | Status: DC | PRN

## 2019-02-11 MED ORDER — TRANEXAMIC ACID-NACL 1000-0.7 MG/100ML-% IV SOLN
1000.0000 mg | Freq: Once | INTRAVENOUS | Status: AC
  Administered 2019-02-11: 1000 mg via INTRAVENOUS
  Filled 2019-02-11 (×4): qty 100

## 2019-02-11 MED ORDER — LACTATED RINGERS IV SOLN: 500.0000 mL | Freq: Once | INTRAVENOUS | Status: DC

## 2019-02-11 MED ORDER — ONDANSETRON HCL 4 MG/2ML IJ SOLN: 4.0000 mg | Freq: Four times a day (QID) | INTRAMUSCULAR | Status: DC | PRN

## 2019-02-11 MED ORDER — LACTATED RINGERS IV SOLN
INTRAVENOUS | Status: DC
  Administered 2019-02-11 (×2): via INTRAVENOUS

## 2019-02-11 MED ORDER — TETANUS-DIPHTH-ACELL PERTUSSIS 5-2.5-18.5 LF-MCG/0.5 IM SUSP: 0.5000 mL | Freq: Once | INTRAMUSCULAR | Status: DC

## 2019-02-11 MED ORDER — PHENYLEPHRINE 40 MCG/ML (10ML) SYRINGE FOR IV PUSH (FOR BLOOD PRESSURE SUPPORT)
80.0000 ug | PREFILLED_SYRINGE | INTRAVENOUS | Status: DC | PRN
  Filled 2019-02-11: qty 10

## 2019-02-11 MED ORDER — FENTANYL-BUPIVACAINE-NACL 0.5-0.125-0.9 MG/250ML-% EP SOLN
12.0000 mL/h | EPIDURAL | Status: DC | PRN
  Filled 2019-02-11: qty 250

## 2019-02-11 MED ORDER — PENICILLIN G 3 MILLION UNITS IVPB - SIMPLE MED
3.0000 10*6.[IU] | INTRAVENOUS | Status: DC
  Administered 2019-02-11: 17:00:00 3 10*6.[IU] via INTRAVENOUS
  Filled 2019-02-11: qty 100

## 2019-02-11 MED ORDER — DIPHENHYDRAMINE HCL 25 MG PO CAPS: 25.0000 mg | ORAL_CAPSULE | Freq: Four times a day (QID) | ORAL | Status: DC | PRN

## 2019-02-11 MED ORDER — PRENATAL MULTIVITAMIN CH
1.0000 | ORAL_TABLET | Freq: Every day | ORAL | Status: DC
  Administered 2019-02-12 – 2019-02-13 (×2): 1 via ORAL
  Filled 2019-02-11 (×2): qty 1

## 2019-02-11 MED ORDER — MISOPROSTOL 25 MCG QUARTER TABLET: 25.0000 ug | ORAL_TABLET | ORAL | Status: DC | PRN

## 2019-02-11 NOTE — Progress Notes (Signed)
Admission questions answered with Diamond, Arbie Cookey, at bedside. Discussed plan of care and answered all questions patient and FOB had.

## 2019-02-11 NOTE — Anesthesia Procedure Notes (Signed)
Epidural Patient location during procedure: OB Start time: 02/11/2019 5:14 PM End time: 02/11/2019 5:26 PM  Staffing Anesthesiologist: Duane Boston, MD Performed: anesthesiologist   Preanesthetic Checklist Completed: patient identified, site marked, pre-op evaluation, timeout performed, IV checked, risks and benefits discussed and monitors and equipment checked  Epidural Patient position: sitting Prep: DuraPrep Patient monitoring: heart rate, cardiac monitor, continuous pulse ox and blood pressure Approach: midline Location: L2-L3 Injection technique: LOR saline  Needle:  Needle type: Tuohy  Needle gauge: 17 G Needle length: 9 cm Needle insertion depth: 7 cm Catheter size: 20 Guage Catheter at skin depth: 12 cm Test dose: negative and Other  Assessment Events: blood not aspirated, injection not painful, no injection resistance and negative IV test  Additional Notes Informed consent obtained prior to proceeding including risk of failure, 1% risk of PDPH, risk of minor discomfort and bruising.  Discussed rare but serious complications including epidural abscess, permanent nerve injury, epidural hematoma.  Discussed alternatives to epidural analgesia and patient desires to proceed.  Timeout performed pre-procedure verifying patient name, procedure, and platelet count.  Patient tolerated procedure well.

## 2019-02-11 NOTE — Anesthesia Preprocedure Evaluation (Signed)
Anesthesia Evaluation  Patient identified by MRN, date of birth, ID band Patient awake    Reviewed: Allergy & Precautions, NPO status , Patient's Chart, lab work & pertinent test results  Airway Mallampati: II  TM Distance: >3 FB Neck ROM: Full    Dental no notable dental hx. (+) Dental Advisory Given   Pulmonary neg pulmonary ROS,    Pulmonary exam normal        Cardiovascular negative cardio ROS Normal cardiovascular exam     Neuro/Psych negative neurological ROS  negative psych ROS   GI/Hepatic negative GI ROS, Neg liver ROS,   Endo/Other  negative endocrine ROS  Renal/GU negative Renal ROS  negative genitourinary   Musculoskeletal negative musculoskeletal ROS (+)   Abdominal   Peds negative pediatric ROS (+)  Hematology negative hematology ROS (+)   Anesthesia Other Findings   Reproductive/Obstetrics (+) Pregnancy                             Anesthesia Physical Anesthesia Plan  ASA: II  Anesthesia Plan: Epidural   Post-op Pain Management:    Induction:   PONV Risk Score and Plan:   Airway Management Planned: Natural Airway  Additional Equipment:   Intra-op Plan:   Post-operative Plan:   Informed Consent:     Dental advisory given  Plan Discussed with: Anesthesiologist  Anesthesia Plan Comments:         Anesthesia Quick Evaluation

## 2019-02-11 NOTE — Discharge Summary (Signed)
Postpartum Discharge Summary     Patient Name: Colleen Blanchard DOB: July 06, 1980 MRN: [id number]  Date of admission: 02/11/2019 Delivering Provider: Jacklyn ShellRESENZO-DISHMON, FRANCES   Date of discharge: 02/13/2019  Admitting diagnosis: CTX some bleeding Intrauterine pregnancy: 4651w3d     Secondary diagnosis:  Active Problems:   Labor and delivery, indication for care   Post-dates pregnancy  Additional problems: hx PPH     Discharge diagnosis: Term Pregnancy Delivered                                                                                                Post partum procedures:None  Augmentation: Pitocin for a few minutes and AROM at delivery Complications: None  Hospital course:  Onset of Labor With Vaginal Delivery     38 y.o. yo [id number] at 5151w3d was admitted in Active Labor on 02/11/2019. Patient had an uncomplicated labor course as follows:  Membrane Rupture Time/Date: 6:56 PM ,02/11/2019   Intrapartum Procedures: Episiotomy: None [1]                                         Lacerations:  None [1]  Patient had a delivery of a Viable infant. 02/11/2019 TXA 1000mg  IV was started at beginning of 2nd stage, Cytotec given PR after placenta d/t extra bleeding.   Information for the patient's newborn:  Jorge NyDuenas-Sixtos, Boy Byrd HesselbachMaria [[id number]][[id number]]  Delivery Method: Vaginal, Spontaneous(Filed from Delivery Summary)     Pateint had an uncomplicated postpartum course.  She is ambulating, tolerating a regular diet, passing flatus, and urinating well. Patient is discharged home in stable condition on 02/13/19.   Magnesium Sulfate recieved: No BMZ received: No  Physical exam  Vitals:   02/12/19 1025 02/12/19 1442 02/12/19 2149 02/13/19 0659  BP: 112/74 119/82 119/75 111/73  Pulse: 75 79 66 66  Resp: 18 16 18 18   Temp: 97.9 F (36.6 C) 98.2 F (36.8 C) 97.8 F (36.6 C) 97.8 F (36.6 C)  TempSrc: Oral Oral Oral Oral  SpO2:   98%   Weight:      Height:       General: alert,  cooperative and no distress Lochia: appropriate Uterine Fundus: firm Incision: N/A DVT Evaluation: No evidence of DVT seen on physical exam. Labs: Lab Results  Component Value Date   WBC 9.6 02/11/2019   HGB 13.5 02/11/2019   HCT 40.0 02/11/2019   MCV 94.1 02/11/2019   PLT 191 02/11/2019   CMP Latest Ref Rng & Units 07/24/2018  Glucose 70 - 99 87  BUN 6 - 20 mg/dL -  Creatinine 3.240.44 - 4.011.00 mg/dL -  Sodium 027135 - 253145 mmol/L -  Potassium 3.5 - 5.1 mmol/L -  Chloride 101 - 111 mmol/L -  CO2 22 - 32 mmol/L -  Calcium 8.9 - 10.3 mg/dL -    Discharge instruction: per After Visit Summary and "Baby and Me Booklet".  After visit meds:  Allergies as of 02/13/2019   No Known Allergies     Medication List  STOP taking these medications   diphenhydrAMINE 25 MG tablet Commonly known as: BENADRYL     TAKE these medications   ibuprofen 800 MG tablet Commonly known as: ADVIL Take 1 tablet (800 mg total) by mouth every 8 (eight) hours as needed.   PRENATAL VITAMIN PO Take by mouth.       Diet: routine diet  Activity: Advance as tolerated. Pelvic rest for 6 weeks.   Outpatient follow up:4 weeks Follow up Appt: Future Appointments  Date Time Provider [place]  03/17/2019  3:35 PM Luvenia Redden, PA-C [facility]   Follow up Visit:   Please schedule this patient for Postpartum visit in: 4 weeks with the following provider: Any provider For C/S patients schedule nurse incision check in weeks 2 weeks: no Low risk pregnancy complicated by:  Delivery mode:  CS Anticipated Birth Control:  Depo PP Procedures needed:   Schedule Integrated BH visit: no      Newborn Data: Live born female  Birth Weight:   APGAR: 81, 9  Newborn Delivery   Birth date/time: 02/11/2019 19:06:00 Delivery type: Vaginal, Spontaneous      Baby Feeding: Breast Disposition:home with mother  Int #[id number] used for this visit   Marcille Buffy DNP, CNM  02/13/19  11:07 AM

## 2019-02-11 NOTE — H&P (Signed)
OBSTETRIC ADMISSION HISTORY AND PHYSICAL  Encounter conducted in Spanish by provider in presence of Spanish interpreter.  Colleen Blanchard is a 38 y.o. female [phone] with IUP at [redacted]w[redacted]d by early [place] presenting for SOL at > 40 weeks. Reports infrequent contractions starting yesterday and becoming more frequent today with some vaginal bleeding. Rating pain during contractions at 8/10. She reports +FMs, No LOF, no VB, no blurry vision, headaches or peripheral edema, and RUQ pain.  She plans on breast feeding. She requests Depo for birth control even if she would go for C-section. She received her prenatal care at [facility]   Dating: By early [place] --->  Estimated Date of Delivery: 02/08/19  Sono: Last done 12/24/2018  @[redacted]w[redacted]d , CWD, normal anatomy, cephalic presentation, posterior placenta with resolved low-lying placenta now 2.5 cm from os, 2234g, normal AFI and CL  Prenatal History/Complications:  Past Medical History: Past Medical History:  Diagnosis Date  . Medical history non-contributory   . No pertinent past medical history   . Postpartum hemorrhage 2009    Past Surgical History: Past Surgical History:  Procedure Laterality Date  . NO PAST SURGERIES      Obstetrical History: OB History    Gravida  5   Para  3   Term  3   Preterm      AB  1   Living  3     SAB  1   TAB      Ectopic      Multiple      Live Births  3           Social History: Social History   Socioeconomic History  . Marital status: Single    Spouse name: Not on file  . Number of children: Not on file  . Years of education: Not on file  . Highest education level: Not on file  Occupational History  . Not on file  Social Needs  . Financial resource strain: Not hard at all  . Food insecurity    Worry: Never true    Inability: Never true  . Transportation needs    Medical: No    Non-medical: Not on file  Tobacco Use  . Smoking status: Never Smoker  . Smokeless tobacco: Never Used   Substance and Sexual Activity  . Alcohol use: Not Currently  . Drug use: No  . Sexual activity: Not Currently    Birth control/protection: None  Lifestyle  . Physical activity    Days per week: Not on file    Minutes per session: Not on file  . Stress: Not on file  Relationships  . Social Herbalist on phone: Not on file    Gets together: Not on file    Attends religious service: Not on file    Active member of club or organization: Not on file    Attends meetings of clubs or organizations: Not on file    Relationship status: Not on file  Other Topics Concern  . Not on file  Social History Narrative  . Not on file    Family History: Family History  Problem Relation Age of Onset  . Hypertension Father     Allergies: No Known Allergies  Medications Prior to Admission  Medication Sig Dispense Refill Last Dose  . diphenhydrAMINE (BENADRYL) 25 MG tablet Take 1 tablet (25 mg total) by mouth every 6 (six) hours as needed. (Patient not taking: Reported on 08/05/2018) 20 tablet 0   . Prenatal  Vit-Fe Fumarate-FA (PRENATAL VITAMIN PO) Take by mouth.   01/28/2019     Review of Systems   All systems reviewed and negative except as stated in HPI  Blood pressure 139/82, pulse 93, temperature 97.7 F (36.5 C), temperature source Oral, resp. rate 18, height 5' 4.5" (1.638 m), weight 89.8 kg, last menstrual period 05/04/2018, SpO2 100 %, unknown if currently breastfeeding. General appearance: alert, cooperative and appears stated age Lungs: normal effort Heart: regular rate  Abdomen: soft, non-tender; bowel sounds normal Pelvic: gravid uterus  Extremities: Homans sign is negative, no sign of DVT Presentation: cephalic Fetal monitoringBaseline: 140 bpm, Variability: Good {> 6 bpm), Accelerations: Reactive and Decelerations: Absent Uterine activityFrequency: Every 3-4 minutes Dilation: 4 Effacement (%): 50 Station: -3 Exam by:: Marily MemosK. Koontz, RN/P. Conty, RN   Prenatal  labs: ABO, Rh: --/--/PENDING (08/13 1230) Antibody: PENDING (08/13 1230) Rubella: Immune (01/23 0000) RPR: Nonreactive (01/23 0000)  HBsAg: Negative (01/23 0000)  HIV: Non-reactive (01/23 0000)  GBS: Positive (07/22 0000)  3 hr Glucola WNL on 07/24/2018 Genetic screening  WNL Anatomy US with low lying placenta, now resolved. Normal anatomy. See last US report above.   Prenatal Transfer Tool  Maternal Diabetes: No Genetic Screening: Normal; outside records (MaterniT21 low risk) Maternal Ultrasounds/Referrals: Normal Fetal Ultrasounds or other Referrals:  None Maternal Substance Abuse:  No Significant Maternal Medications:  None Significant Maternal Lab Results: Group B Strep positive  Results for orders placed or performed during the hospital encounter of 02/11/19 (from the past 24 hour(s))  SARS Coronavirus 2 [facility](Hospital order, Performed in [facility] lab) Nasopharyngeal Nasopharyngeal Swab   Collection Time: 02/11/19 11:43 AM   Specimen: Nasopharyngeal Swab  Result Value Ref Range   SARS Coronavirus 2 NEGATIVE NEGATIVE  CBC   Collection Time: 02/11/19 12:27 PM  Result Value Ref Range   WBC 9.6 4.0 - 10.5 K/uL   RBC 4.25 3.87 - 5.11 MIL/uL   Hemoglobin 13.5 12.0 - 15.0 g/dL   HCT 16.140.0 09.636.0 - 04.546.0 %   MCV 94.1 80.0 - 100.0 fL   MCH 31.8 26.0 - 34.0 pg   MCHC 33.8 30.0 - 36.0 g/dL   RDW 40.912.0 81.111.5 - 91.415.5 %   Platelets 191 150 - 400 K/uL   nRBC 0.0 0.0 - 0.2 %  Type and screen [facility]   Collection Time: 02/11/19 12:30 PM  Result Value Ref Range   ABO/RH(D) PENDING    Antibody Screen PENDING    Sample Expiration      02/14/2019,2359 Performed at [facility], [address] [place], [place] [address]     Patient Active Problem List   Diagnosis Date Noted  . Labor and delivery, indication for care 02/11/2019  . Post-dates pregnancy 02/11/2019  . Language barrier 12/02/2018  . AMA (advanced maternal age) multigravida 35+ 11/30/2018   . Supervision of high risk pregnancy, antepartum 11/30/2018  . GBS (group B streptococcus) UTI complicating pregnancy 11/30/2018    Assessment/Plan:  Sanda LingerMaria Duenas-Sixtos is a 38 y.o. G5P3013 at 2460w3d here for SOL at > 40 weeks.  #Labor: Latent labor but with potential for quick labor due to parity and patient also > 40 weeks and in need of GBS ppx. Will consider augmentation after adequate GBS ppx #Pain: Desires epidural eventually #FWB:  Cat I; EFW: 3700g #ID:  GBS pos, PCN initiated on admission  #MOF: Breast #MOC: Depo #Circ:  Declines   Jerilynn Birkenheadhelsea Attie Nawabi, MD [facility] Fellow, [facility]  for [facility], [facility] 02/11/2019, 1:20 PM

## 2019-02-11 NOTE — MAU Note (Signed)
Pt with c/o of vaginal bleeding starting at 0300 this morning, contractions increasing in strength. Yesterday, vaginal bleeding was rose colored, but now says she has red vaginal blood mixed with mucus this morning. Denies recent intercourse. Endorses +FM. Describes 10/10 pelvic pressure pain and rates pain 8/10 for contractions.

## 2019-02-12 LAB — RPR: RPR Ser Ql: NONREACTIVE

## 2019-02-12 MED ORDER — MEDROXYPROGESTERONE ACETATE 150 MG/ML IM SUSP
150.0000 mg | Freq: Once | INTRAMUSCULAR | Status: AC
  Administered 2019-02-12: 150 mg via INTRAMUSCULAR
  Filled 2019-02-12: qty 1

## 2019-02-12 NOTE — Plan of Care (Signed)
  Problem: Elimination: Goal: Will not experience complications related to urinary retention Outcome: Adequate for Discharge   Problem: Education: Goal: Knowledge of condition will improve Outcome: Adequate for Discharge   Problem: Activity: Goal: Will verbalize the importance of balancing activity with adequate rest periods Outcome: Adequate for Discharge Goal: Ability to tolerate increased activity will improve Outcome: Adequate for Discharge   Problem: Life Cycle: Goal: Chance of risk for complications during the postpartum period will decrease Outcome: Adequate for Discharge

## 2019-02-12 NOTE — Anesthesia Postprocedure Evaluation (Signed)
Anesthesia Post Note  Patient: Colleen Blanchard  Procedure(s) Performed: AN AD [place]     Patient location during evaluation: Mother Baby Anesthesia Type: Epidural Level of consciousness: awake Pain management: satisfactory to patient Vital Signs Assessment: post-procedure vital signs reviewed and stable Respiratory status: spontaneous breathing Cardiovascular status: stable Anesthetic complications: no    Last Vitals:  Vitals:   02/12/19 0715 02/12/19 1025  BP: 110/69 112/74  Pulse: 70 75  Resp: 18 18  Temp: 36.8 C 36.6 C  SpO2:      Last Pain:  Vitals:   02/12/19 1025  TempSrc: Oral  PainSc:    Pain Goal:                Epidural/Spinal Function Cutaneous sensation: Normal sensation (02/12/19 1018), Patient able to flex knees: Yes (02/12/19 1018), Patient able to lift hips off bed: Yes (02/12/19 1018), Back pain beyond tenderness at insertion site: No (02/12/19 1018), Progressively worsening motor and/or sensory loss: No (02/12/19 1018), Bowel and/or bladder incontinence post epidural: No (02/12/19 1018)  Casimer Lanius

## 2019-02-12 NOTE — Plan of Care (Signed)
  Problem: Education: Goal: Knowledge of General Education information will improve Description: Including pain rating scale, medication(s)/side effects and non-pharmacologic comfort measures Outcome: Completed/Met   Problem: Clinical Measurements: Goal: Ability to maintain clinical measurements within normal limits will improve Outcome: Completed/Met Goal: Will remain free from infection Outcome: Completed/Met Goal: Diagnostic test results will improve Outcome: Completed/Met Goal: Respiratory complications will improve Outcome: Completed/Met Goal: Cardiovascular complication will be avoided Outcome: Completed/Met   Problem: Activity: Goal: Risk for activity intolerance will decrease Outcome: Completed/Met   Problem: Nutrition: Goal: Adequate nutrition will be maintained Outcome: Completed/Met   Problem: Elimination: Goal: Will not experience complications related to bowel motility Outcome: Completed/Met   Problem: Pain Managment: Goal: General experience of comfort will improve Outcome: Completed/Met   Problem: Safety: Goal: Ability to remain free from injury will improve Outcome: Completed/Met

## 2019-02-12 NOTE — Progress Notes (Signed)
RN had called in house interpreter to see patient around 2100, due to patient sleeping. Interpreter states she would be going home at 2200. When call at 2100 interpreter stated she had to leave now because she was told to come in earlier today. Rn will use stratus interpreter instead.

## 2019-02-12 NOTE — Progress Notes (Signed)
POSTPARTUM PROGRESS NOTE  Post Partum Day 1  Video translator was used throughout this encounter.  Subjective:  Colleen Blanchard is a 38 y.o. H2C9470 s/p SVD at [redacted]w[redacted]d.  She reports she is doing well. No acute events overnight. She denies any problems with ambulating, voiding or po intake. Denies nausea or vomiting.  Pain is moderately controlled - still has pelvic pain.  Lochia is appropriate (clear discharge per patient).  Objective: Blood pressure 110/69, pulse 70, temperature 98.2 F (36.8 C), temperature source Oral, resp. rate 18, height 5' 4.5" (1.638 m), weight 89.8 kg, last menstrual period 05/04/2018, SpO2 99 %, unknown if currently breastfeeding.  Physical Exam:  General: alert, cooperative and no distress Chest: no respiratory distress Heart:regular rate, distal pulses intact Abdomen: soft, nontender,  Uterine Fundus: firm, appropriately tender DVT Evaluation: No calf swelling or tenderness Extremities: No edema Skin: warm, dry  Recent Labs    02/11/19 1227  HGB 13.5  HCT 40.0    Assessment/Plan: Colleen Blanchard is a 38 y.o. [id number] s/p SVD at [redacted]w[redacted]d .  PPD#1 - Doing well      Routine postpartum care Contraception: Depo shot Feeding: Breast Dispo: Plan for discharge 8/14.   LOS: 1 day   [place] Student 02/12/2019, 7:27 AM

## 2019-02-12 NOTE — Lactation Note (Signed)
This note was copied from a baby's chart. Lactation Consultation Note  Patient Name: Colleen Blanchard [id number]'I Date: 02/12/2019 Reason for consult: Initial assessment;Term;Infant weight loss  16 hours old FT female who is being mostly BF by his mother, she's a P4 and experienced BF. She was able to BF her first child for 1 month, her second one for 24 months and her third one for 36 months. But mom is puzzled how this baby is so difficult to BF, he's been very sleepy, spitty and congested too, parents even tried to feed baby a bottle of Similac formula with the same results. Baby is at 2% weight loss.  Offered assistance with latch and mom agreed to have baby STS, she told LC that she just tried feeding baby 10 minutes ago, but that she would be willing to try again. Mom hand expressed some colostrum and LC showed parents how to finger fed baby, he was able to suck on a gloved finger.   [place] unswadled baby, changed his diaper and took him STS to mom's left breast in cross cradle position (mom kept switching to typical cradle though). Baby had a loud burp while he was attempting to latch, just like mom described earlier, he was spitty, and kept unlatching, baby hasn't had his bath yet, once he does it may ease the congestion. Baby stayed on and off for 5 minutes, still nursing/attempting when exiting the room. NL Lauren present during [facility] consultation, mom didn't any any questions regarding baby other than his feedings.  Feeding plan:  1. Encouraged mom to feed baby STS 8-12 times/24 hours or sooner if feeding cues are present 2. Hand expression and finger feeding were also encouraged  BF brochure (SP), BF resources (SP) and feeding diary were reviewed. Parents reported all questions and concerns were answered, they're both aware of [facility] services and will call call PRN.   Maternal Data Formula Feeding for Exclusion: No Has patient been taught Hand Expression?: Yes Does the patient have  breastfeeding experience prior to this delivery?: Yes  Feeding Feeding Type: Breast Fed  LATCH Score Latch: Repeated attempts needed to sustain latch, nipple held in mouth throughout feeding, stimulation needed to elicit sucking reflex.(baby very spitty and congested, kept breaking the latch)  Audible Swallowing: None  Type of Nipple: Everted at rest and after stimulation  Comfort (Breast/Nipple): Soft / non-tender  Hold (Positioning): No assistance needed to correctly position infant at breast.(minimal assistance needed)  LATCH Score: 7  Interventions Interventions: Breast feeding basics reviewed;Assisted with latch;Skin to skin;Breast massage;Hand express;Breast compression;Adjust position;Support pillows  Lactation Tools Discussed/Used WIC Program: No   Consult Status Consult Status: Follow-up Date: 02/13/19 Follow-up type: In-patient    [place] 02/12/2019, 11:52 AM

## 2019-02-12 NOTE — Progress Notes (Signed)
Parent request formula to supplement breast feeding due to supplementation. Parents have been informed of small tummy size of newborn, taught hand expression and understands the possible consequences of formula to the health of the infant. The possible consequences shared with patent include 1) Loss of confidence in breastfeeding 2) Engorgement 3) Allergic sensitization of baby(asthema/allergies) and 4) decreased milk supply for mother.After discussion of the above the mother decided to supplement with Gerber and a slow flow nipple.The tool used to give formula supplement will be a bottle. Supplement amounts and preparation given to parents, handouts given in Spanish.  

## 2019-02-13 ENCOUNTER — Other Ambulatory Visit (HOSPITAL_COMMUNITY): Payer: Self-pay

## 2019-02-13 DIAGNOSIS — O480 Post-term pregnancy: Secondary | ICD-10-CM

## 2019-02-13 DIAGNOSIS — Z3A40 40 weeks gestation of pregnancy: Secondary | ICD-10-CM

## 2019-02-13 DIAGNOSIS — O99824 Streptococcus B carrier state complicating childbirth: Secondary | ICD-10-CM

## 2019-02-13 MED ORDER — IBUPROFEN 800 MG PO TABS: 800.0000 mg | ORAL_TABLET | Freq: Three times a day (TID) | ORAL | 0 refills | Status: DC | PRN

## 2019-02-13 NOTE — Lactation Note (Signed)
This note was copied from a baby's chart. Lactation Consultation Note  Patient Name: Colleen Blanchard [id number] Date: 02/13/2019 Reason for consult: Follow-up assessment;Term  P4 mother whose infant is now 78 hours old.  Mother breast fed her other three children from 1-36 months each.  Mother's feeding preference on admission was breast/bottle.  In house [place] interpreter, Colleen Blanchard, used for interpretation.  Mother had no questions related to breast feeding.  Baby is no longer spitty and she feels like latching is going well.  Her breasts are soft and non tender and nipples are everted and intact. She can hear swallows with feedings.  She has supplemented a little bit with formula.  Encouraged mother to continue feeding 8-12 times/24 hours.  Mother plans to supplement after feedings.  I encouraged her to always put baby to the breast prior to supplementing.  Mother verbalized understanding.    Engorgement prevention/treatment reviewed.  Manual pump with instructions for use given.  #24 flange changed to a #27 flange size for greater fit and comfort.  Mother will be a "stay at home" mother and does not have a DEBP.  She does not have private insurance or participates in the [facility] program.    Mother has our OP phone number for questions after discharge.  RN in room to assess mother and baby.  Father present.     Maternal Data Formula Feeding for Exclusion: Yes Reason for exclusion: Mother's choice to formula and breast feed on admission Has patient been taught Hand Expression?: Yes Does the patient have breastfeeding experience prior to this delivery?: Yes  Feeding Feeding Type: Breast Fed  LATCH Score                   Interventions    Lactation Tools Discussed/Used WIC Program: No Pump Review: Setup, frequency, and cleaning;Milk Storage Initiated by:: Colleen Blanchard Date initiated:: 02/13/19   Consult Status Consult Status: Complete Date: 02/13/19 Follow-up  type: Call as needed    Colleen Blanchard Colleen Blanchard 02/13/2019, 8:20 AM

## 2019-02-13 NOTE — Plan of Care (Signed)
  Problem: Education: Goal: Knowledge of General Education information will improve Description: Including pain rating scale, medication(s)/side effects and non-pharmacologic comfort measures Outcome: Completed/Met   Problem: Clinical Measurements: Goal: Ability to maintain clinical measurements within normal limits will improve Outcome: Completed/Met Goal: Will remain free from infection Outcome: Completed/Met Goal: Diagnostic test results will improve Outcome: Completed/Met Goal: Respiratory complications will improve Outcome: Completed/Met Goal: Cardiovascular complication will be avoided Outcome: Completed/Met   Problem: Activity: Goal: Risk for activity intolerance will decrease Outcome: Completed/Met   Problem: Nutrition: Goal: Adequate nutrition will be maintained Outcome: Completed/Met   Problem: Elimination: Goal: Will not experience complications related to bowel motility Outcome: Completed/Met Goal: Will not experience complications related to urinary retention 02/13/2019 0527 by Yancey Flemings, RN Outcome: Completed/Met 02/12/2019 2233 by Yancey Flemings, RN Outcome: Adequate for Discharge   Problem: Pain Managment: Goal: General experience of comfort will improve Outcome: Completed/Met   Problem: Safety: Goal: Ability to remain free from injury will improve Outcome: Completed/Met   Problem: Skin Integrity: Goal: Risk for impaired skin integrity will decrease Outcome: Completed/Met   Problem: Education: Goal: Knowledge of condition will improve 02/13/2019 0527 by Yancey Flemings, RN Outcome: Completed/Met 02/12/2019 2233 by Yancey Flemings, RN Outcome: Adequate for Discharge   Problem: Activity: Goal: Will verbalize the importance of balancing activity with adequate rest periods 02/13/2019 0527 by Yancey Flemings, RN Outcome: Completed/Met 02/12/2019 2233 by Yancey Flemings, RN Outcome: Adequate for Discharge Goal: Ability to tolerate  increased activity will improve 02/13/2019 0527 by Yancey Flemings, RN Outcome: Completed/Met 02/12/2019 2233 by Yancey Flemings, RN Outcome: Adequate for Discharge   Problem: Coping: Goal: Ability to identify and utilize available resources and services will improve Outcome: Completed/Met   Problem: Life Cycle: Goal: Chance of risk for complications during the postpartum period will decrease 02/13/2019 0527 by Yancey Flemings, RN Outcome: Completed/Met 02/12/2019 2233 by Yancey Flemings, RN Outcome: Adequate for Discharge   Problem: Role Relationship: Goal: Ability to demonstrate positive interaction with newborn will improve Outcome: Completed/Met

## 2019-02-15 ENCOUNTER — Inpatient Hospital Stay (HOSPITAL_COMMUNITY): Payer: Self-pay

## 2019-02-15 ENCOUNTER — Inpatient Hospital Stay (HOSPITAL_COMMUNITY): Admission: AD | Admit: 2019-02-15 | Payer: Self-pay | Source: Home / Self Care | Admitting: Obstetrics & Gynecology

## 2019-03-17 ENCOUNTER — Encounter: Payer: Self-pay | Admitting: Medical

## 2019-03-17 ENCOUNTER — Ambulatory Visit (INDEPENDENT_AMBULATORY_CARE_PROVIDER_SITE_OTHER): Payer: Self-pay | Admitting: Medical

## 2019-03-17 ENCOUNTER — Other Ambulatory Visit: Payer: Self-pay

## 2019-03-17 DIAGNOSIS — Z3009 Encounter for other general counseling and advice on contraception: Secondary | ICD-10-CM

## 2019-03-17 DIAGNOSIS — Z1389 Encounter for screening for other disorder: Secondary | ICD-10-CM

## 2019-03-17 NOTE — Progress Notes (Signed)
Subjective:     Colleen Blanchard is a 38 y.o. female who presents for a postpartum visit. She is 4 weeks postpartum following a spontaneous vaginal delivery. I have fully reviewed the prenatal and intrapartum course. The delivery was at 40/3 gestational weeks. Outcome: spontaneous vaginal delivery. Anesthesia: epidural. Postpartum course has been normal. Baby's course has been normal. Baby is feeding by bottle - Emfamil Infant. Bleeding staining only. Bowel function is normal. Bladder function is normal. Patient is not sexually active. Contraception method is none. Postpartum depression screening: negative.  The following portions of the patient's history were reviewed and updated as appropriate: allergies, current medications, past family history, past medical history, past social history, past surgical history and problem list.  Review of Systems Pertinent items are noted in HPI.   Objective:    BP 126/78   Pulse 90   Temp 98.7 F (37.1 C)   Wt 187 lb 14.4 oz (85.2 kg)   Breastfeeding No   BMI 31.75 kg/m   General:  alert and cooperative   Breasts:  not performed  Lungs: clear to auscultation bilaterally  Heart:  regular rate and rhythm, S1, S2 normal, no murmur, click, rub or gallop  Abdomen: soft, non-tender; bowel sounds normal; no masses,  no organomegaly   Vulva:  not evaluated  Vagina: not evaluated  Cervix:  not evaluated  Corpus: not examined  Adnexa:  not evaluated  Rectal Exam: Not performed.        Assessment:     Normal postpartum exam. Pap smear not done at today's visit.  Normal 2020.  Birth control counseling   Plan:    1. Contraception: Depo-Provera injections - Given in the hospital prior to discharge  2. Patient would like IUD vs Nexplanon, likely IUD, advised to call [facility] for appointment due to lack of insurance coverage currently  3. Follow up in: 1 year for annual exam or sooner as needed.    Luvenia Redden, PA-C  03/17/2019 4:16 PM

## 2019-03-17 NOTE — Patient Instructions (Signed)
Eleccin del mtodo anticonceptivo Contraception Choices La anticoncepcin, o los mtodos anticonceptivos, son medidas que se toman o maneras a fin de intentar no quedar embarazada. Mtodo anticonceptivo hormonal Este tipo de mtodo anticonceptivo contiene hormonas. A continuacin se mencionan algunos tipos de mtodos anticonceptivos hormonales:  Un tubo que se coloca debajo de la piel del brazo (implante). El tubo puede permanecer en el lugar durante 3aos.  Inyecciones que debe recibir cada 3meses.  Pldoras que debe tomar todos los das (pldoras anticonceptivas).  Un parche que se debe cambiar 1vez por semana durante 3semanas (parche anticonceptivo). Despus de ese tiempo, el parche se debe retirar durante 1semana.  Un anillo que se coloca en la vagina. El anillo se deja colocado durante 3 semanas. Luego, se debe retirar de la vagina durante 1semana. Luego, se coloca un nuevo anillo en la vagina.  Pldoras que se deben tomar despus de tener sexo sin proteccin (pldoras anticonceptivas de emergencia). Mtodos anticonceptivos de barrera A continuacin se mencionan algunos tipos de mtodos anticonceptivos de barrera:  Una cubierta delgada que se coloca sobre el pene antes de tener sexo (preservativo masculino). La cubierta se desecha despus de tener sexo.  Una cubierta blanda y suelta que se coloca en la vagina antes de tener sexo (preservativo femenino). La cubierta se desecha despus de tener sexo.  Un dispositivo de goma que se aplica sobre el cuello uterino (diafragma). Este dispositivo debe fabricarse para usted. Se coloca en la vagina antes de tener sexo. Se debe dejar colocado durante 6 a 8horas despus de tener sexo. Se debe retirar en un plazo de 24horas.  Un capuchn pequeo y suave que se fija sobre el cuello uterino (capuchn cervical). Este capuchn debe fabricarse para usted. Se debe dejar colocado durante 6 a 8horas despus de tener sexo. Se debe retirar en un  plazo de 48horas.  Una esponja que se coloca en la vagina antes de tener sexo. Se debe dejar colocada durante al menos 6horas despus de tener sexo. Se debe retirar en un plazo de 30horas. Luego, debe desecharse.  Una sustancia qumica que destruye o impide que los espermatozoides ingresen al tero (espermicida). Se puede presentar en forma de pldora, crema, gel o espuma y se debe colocar en la vagina. La sustancia qumica se debe usar al menos de 10 a 15minutos antes de tener sexo. Dispositivo anticonceptivo intrauterino (DIU) El DIU es un pequeo dispositivo plstico en forma de T. Se coloca en el interior del tero. Existen dos tipos diferentes:  DIU hormonal. Este tipo puede permanecer colocado durante 3 a 5aos.  DIU de cobre. Este tipo puede permanecer colocado durante 10aos. Mtodos anticonceptivos permanentes A continuacin se mencionan algunos tipos de mtodos anticonceptivos permanentes:  Ciruga para obstruir las trompas de Falopio.  Colocacin de un dispositivo en cada una de las trompas de Falopio.  Ciruga para atar los conductos que transportan el esperma (vasectoma). Mtodos anticonceptivos por planificacin natural A continuacin se mencionan algunos mtodos anticonceptivos por planificacin natural:  No tener sexo en los das frtiles de la mujer.  Usar un calendario a fin de: ? Llevar un registro de la duracin de cada perodo. ? Determinar en qu das se podra producir el embarazo. ? Planificar no tener sexo en los das en que se podra producir el embarazo.  Reconocer los sntomas de la ovulacin y no tener sexo durante la ovulacin. Una manera en que la mujer puede detectar la ovulacin es controlarse la temperatura.  Esperar para tener sexo hasta despus de la   ovulacin. Resumen  La anticoncepcin, o los mtodos anticonceptivos, son medidas que se toman o maneras a fin de intentar no quedar embarazada.  Los mtodos anticonceptivos hormonales  incluyen implantes, inyecciones, pldoras, parches, anillos vaginales y pldoras anticonceptivas de emergencia.  Los mtodos anticonceptivos de barrera pueden incluir preservativos masculinos, preservativos femeninos, diafragmas, capuchones cervicales, esponjas y espermicidas.  Existen dos tipos diferentes de DIU (dispositivo intrauterino) anticonceptivo. Un DIU puede colocarse en el tero de una mujer para evitar el embarazo durante 3 a 5 aos.  La esterilizacin permanente puede realizarse mediante un procedimiento para hombres, mujeres o ambos.  Los mtodos anticonceptivos por planificacin familiar natural incluyen no tener sexo en los das frtiles de la mujer. Esta informacin no tiene como fin reemplazar el consejo del mdico. Asegrese de hacerle al mdico cualquier pregunta que tenga. Document Released: 10/02/2010 Document Revised: 02/18/2018 Document Reviewed: 02/05/2017 Elsevier Patient Education  2020 Elsevier Inc.   

## 2019-03-17 NOTE — Progress Notes (Signed)
Pt wants to discuss [facility] today.

## 2019-09-16 ENCOUNTER — Ambulatory Visit: Payer: Self-pay | Attending: Internal Medicine

## 2019-09-16 DIAGNOSIS — Z23 Encounter for immunization: Secondary | ICD-10-CM

## 2019-09-16 NOTE — Progress Notes (Signed)
   Covid-19 Vaccination Clinic  Name:  Colleen Blanchard    MRN: [id number] DOB: 27-Nov-1980  09/16/2019  Colleen Blanchard was observed post Covid-19 immunization for 15 minutes without incident. She was provided with Vaccine Information Sheet and instruction to access the V-Safe system.   Colleen Blanchard was instructed to call 911 with any severe reactions post vaccine: Marland Kitchen Difficulty breathing  . Swelling of face and throat  . A fast heartbeat  . A bad rash all over body  . Dizziness and weakness   Immunizations Administered    Name Date Dose VIS Date Route   Pfizer COVID-19 Vaccine 09/16/2019  8:29 AM 0.3 mL 06/11/2019 Intramuscular   Manufacturer: [employer], [employer]   Lot: [id number]   NDC: [address]-7

## 2019-10-12 ENCOUNTER — Ambulatory Visit: Payer: Self-pay | Attending: Internal Medicine

## 2019-10-12 DIAGNOSIS — Z23 Encounter for immunization: Secondary | ICD-10-CM

## 2019-10-12 NOTE — Progress Notes (Signed)
   Covid-19 Vaccination Clinic  Name:  Colleen Blanchard    MRN: [id number] DOB: 07/16/1980  10/12/2019  [name] was observed post Covid-19 immunization for 15 minutes without incident. She was provided with Vaccine Information Sheet and instruction to access the V-Safe system.   [name] was instructed to call 911 with any severe reactions post vaccine: Marland Kitchen Difficulty breathing  . Swelling of face and throat  . A fast heartbeat  . A bad rash all over body  . Dizziness and weakness   Immunizations Administered    Name Date Dose VIS Date Route   Pfizer COVID-19 Vaccine 10/12/2019  8:25 AM 0.3 mL 06/11/2019 Intramuscular   Manufacturer: [employer], [employer]   Lot: [id number]   NDC: [address]-0

## 2019-12-21 IMAGING — US US FETAL BPP W/NONSTRESS
1 series · 13 of 13 positions shown · non-contrast
Comparison: none

[Series 1: us fetal bpp w/nonstress · 13 acquisitions, 13 frames shown]
[im 1/13]
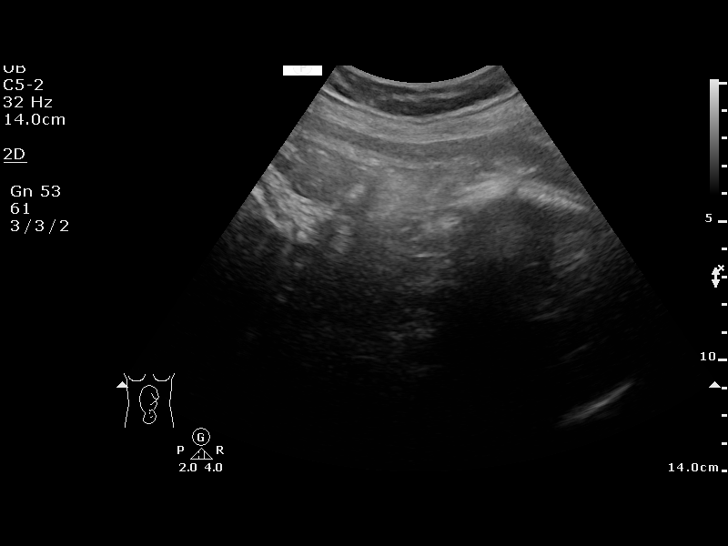
[im 2/13]
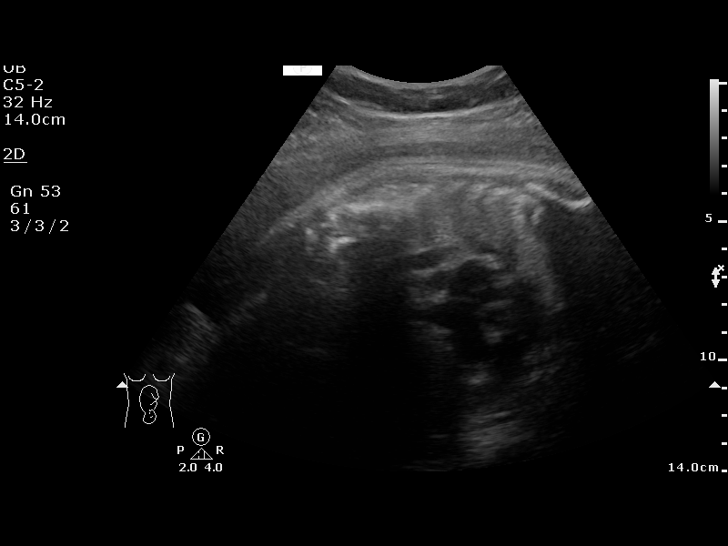
[im 3/13]
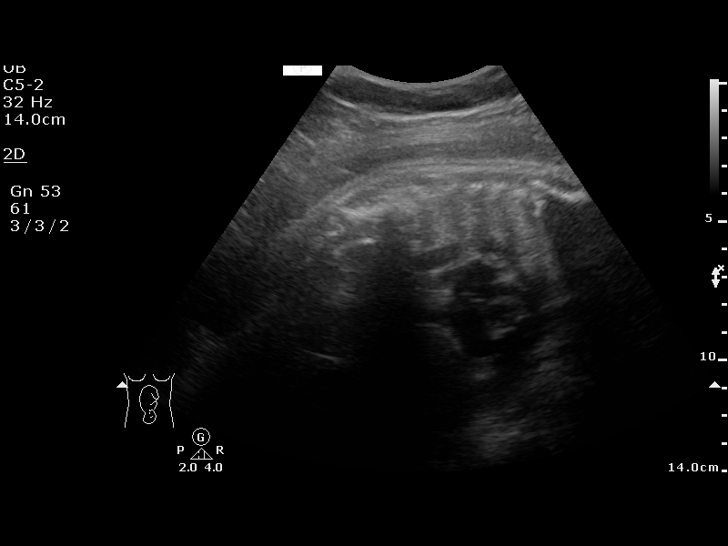
[im 4/13]
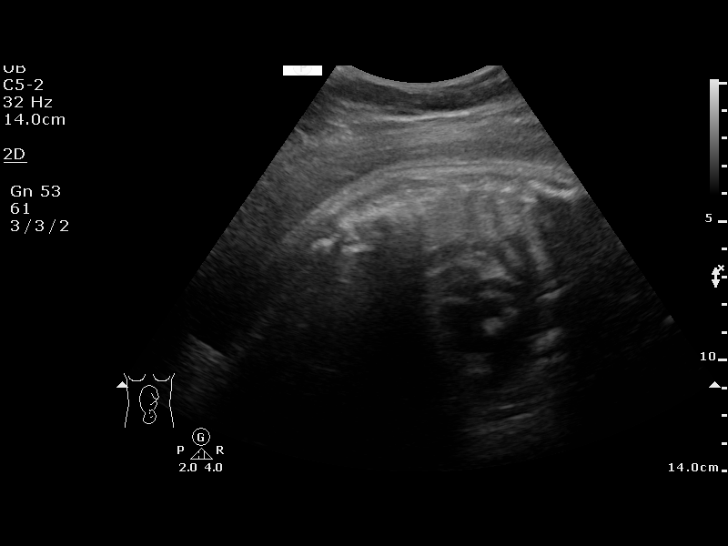
[im 5/13]
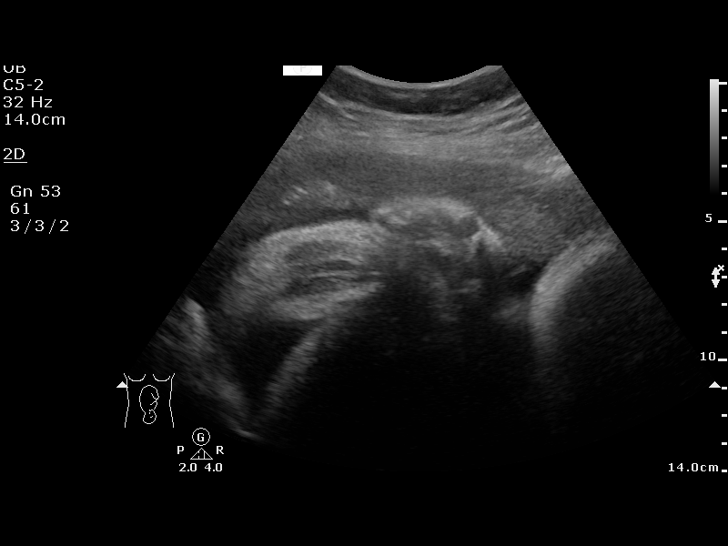
[im 6/13]
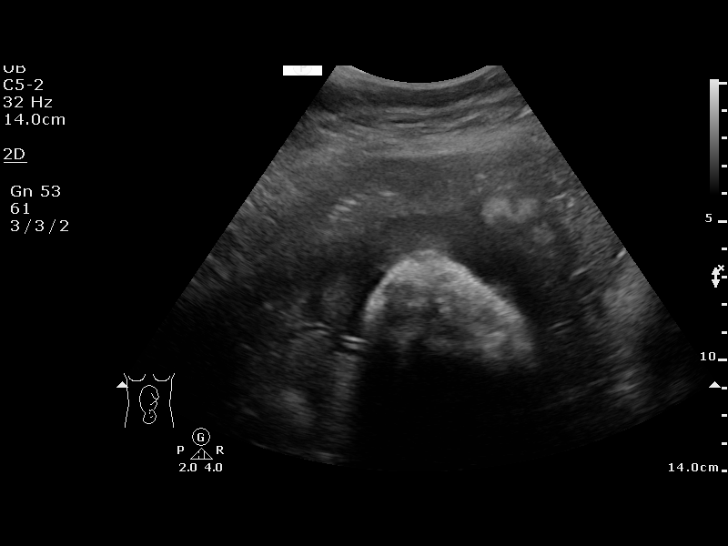
[im 7/13]
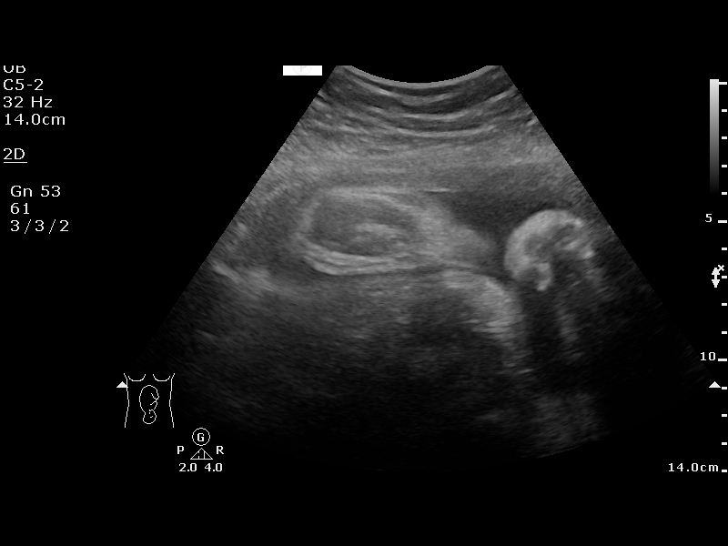
[im 8/13]
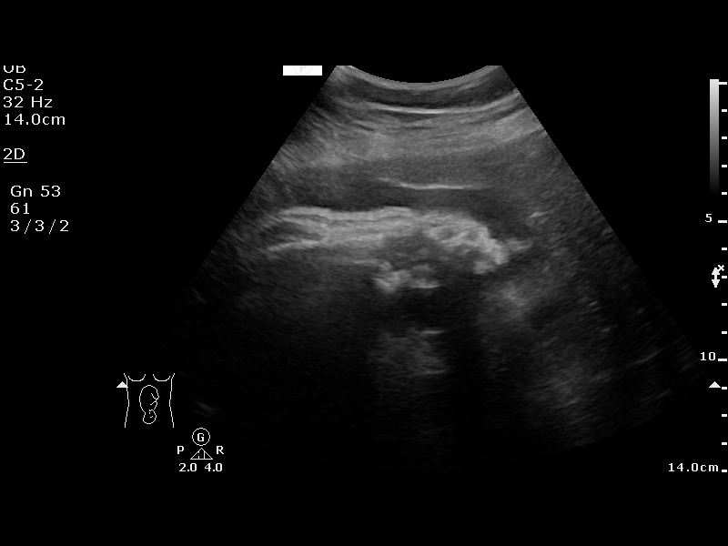
[im 9/13]
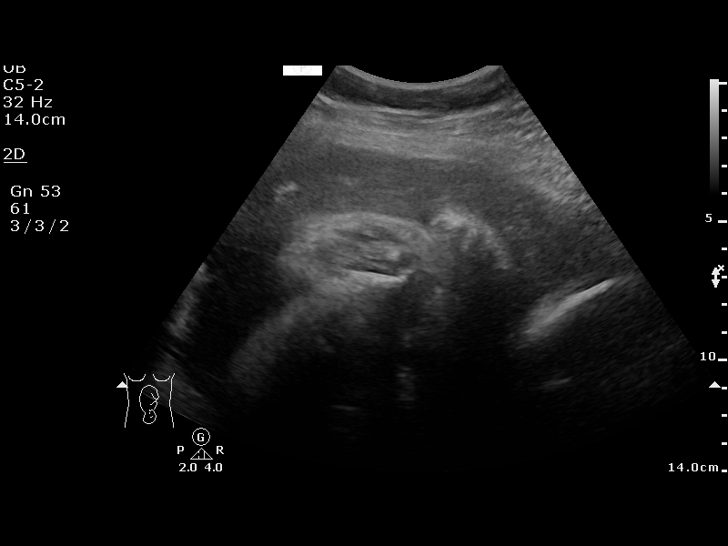
[im 10/13]
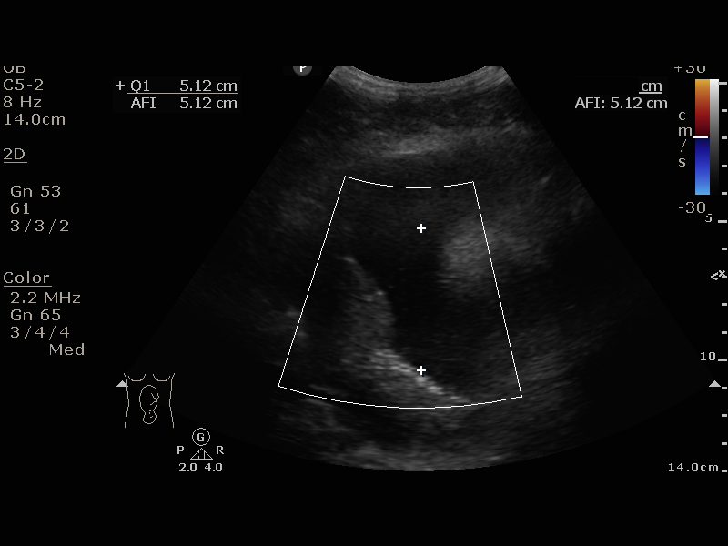
[im 11/13]
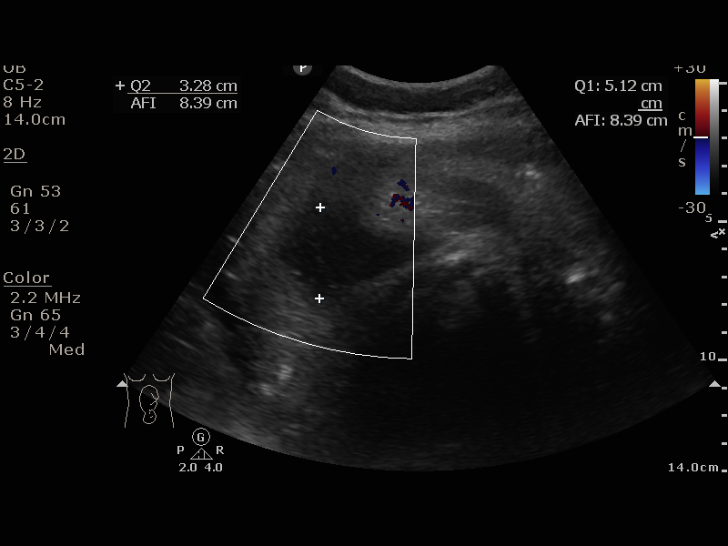
[im 12/13]
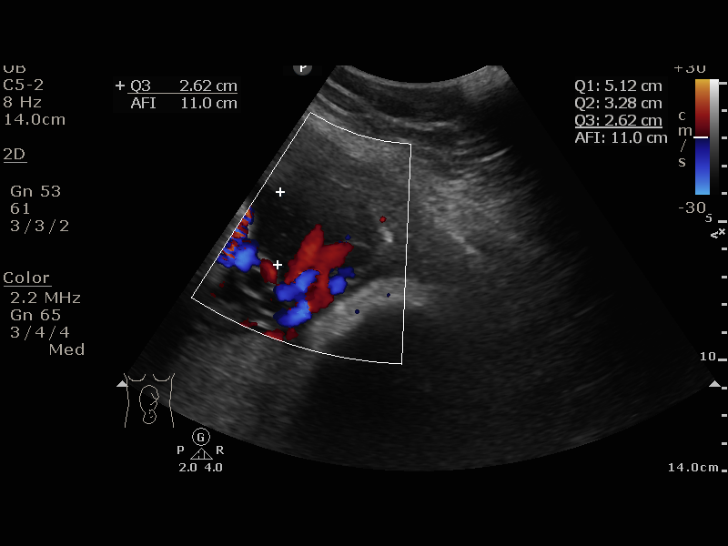
[im 13/13]
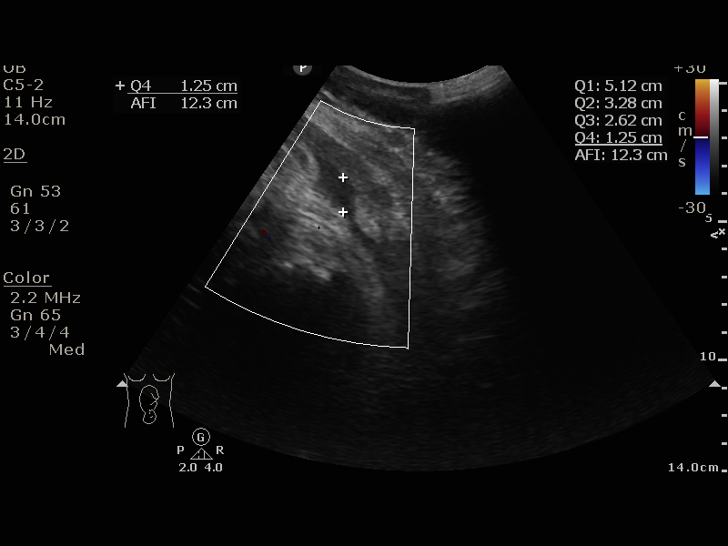

[13 of 13 positions shown; findings below may reference images not displayed]

[REDACTED]
                                                            [REDACTED]care [facility]
                   Marcela NP

  1  US FETAL BPP W/NONSTRESS             76818.4      THER[place]
 ----------------------------------------------------------------------

 ----------------------------------------------------------------------
Indications

  Postdate pregnancy (40-42 weeks)

 ----------------------------------------------------------------------
Vital Signs

                                                Height:        5'5"
Fetal Evaluation

 Num Of Fetuses:         1
 Preg. Location:         Intrauterine
 Cardiac Activity:       Observed
 Fetal Lie:              Maternal right side
 Presentation:           Cephalic

 Amniotic Fluid
 AFI FV:      Within normal limits

 AFI Sum(cm)     %Tile       Largest Pocket(cm)
 12.27           51

 RUQ(cm)       RLQ(cm)       LUQ(cm)        LLQ(cm)

Biophysical Evaluation

 Amniotic F.V:   Pocket => 2 cm two         F. Tone:        Observed
                 planes
 F. Movement:    Observed                   N.S.T:          Reactive
 F. Breathing:   Observed                   Score:          [DATE]
OB History

 Gravidity:    5         Term:   3         SAB:   1
 Living:       3
Gestational Age

 LMP:           40w 2d        Date:  05/04/18                 EDD:   02/08/19
 Best:          40w 2d     Det. By:  LMP  (05/04/18)          EDD:   02/08/19
Impression

 Antenatal testing is reassuring with BPP [DATE]. Normal
 amniotic fluid volume.
Recommendations

 Delivery indicated at 41 weeks.
              SHIRO PADUA

## 2023-07-10 ENCOUNTER — Other Ambulatory Visit: Payer: Self-pay | Admitting: Obstetrics & Gynecology

## 2023-07-10 DIAGNOSIS — Z1231 Encounter for screening mammogram for malignant neoplasm of breast: Secondary | ICD-10-CM

## 2023-08-14 ENCOUNTER — Ambulatory Visit
Admission: RE | Admit: 2023-08-14 | Discharge: 2023-08-14 | Disposition: A | Discharge status: Home / Self Care | Payer: No Typology Code available for payment source | Source: Ambulatory Visit | Attending: Obstetrics & Gynecology | Admitting: Obstetrics & Gynecology

## 2023-08-14 DIAGNOSIS — Z1231 Encounter for screening mammogram for malignant neoplasm of breast: Secondary | ICD-10-CM

## 2023-10-22 ENCOUNTER — Encounter (HOSPITAL_COMMUNITY): Payer: Self-pay | Admitting: *Deleted

## 2023-10-22 ENCOUNTER — Emergency Department (HOSPITAL_COMMUNITY)

## 2023-10-22 ENCOUNTER — Other Ambulatory Visit: Payer: Self-pay

## 2023-10-22 ENCOUNTER — Emergency Department (HOSPITAL_COMMUNITY)
Admission: EM | Admit: 2023-10-22 | Discharge: 2023-10-23 | Disposition: A | Discharge status: Home / Self Care | Attending: Emergency Medicine | Admitting: Emergency Medicine

## 2023-10-22 DIAGNOSIS — Z79899 Other long term (current) drug therapy: Secondary | ICD-10-CM | POA: Insufficient documentation

## 2023-10-22 DIAGNOSIS — I10 Essential (primary) hypertension: Secondary | ICD-10-CM | POA: Insufficient documentation

## 2023-10-22 DIAGNOSIS — R079 Chest pain, unspecified: Secondary | ICD-10-CM

## 2023-10-22 LAB — CBC
HCT: 39.3 % (ref 36.0–46.0)
Hemoglobin: 13 g/dL (ref 12.0–15.0)
MCH: 30 pg (ref 26.0–34.0)
MCHC: 33.1 g/dL (ref 30.0–36.0)
MCV: 90.8 fL (ref 80.0–100.0)
Platelets: 240 10*3/uL (ref 150–400)
RBC: 4.33 MIL/uL (ref 3.87–5.11)
RDW: 12.9 % (ref 11.5–15.5)
WBC: 8 10*3/uL (ref 4.0–10.5)
nRBC: 0 % (ref 0.0–0.2)

## 2023-10-22 LAB — BASIC METABOLIC PANEL WITH GFR
Anion gap: 9 (ref 5–15)
BUN: 10 mg/dL (ref 6–20)
CO2: 23 mmol/L (ref 22–32)
Calcium: 9.1 mg/dL (ref 8.9–10.3)
Chloride: 108 mmol/L (ref 98–111)
Creatinine, Ser: 0.79 mg/dL (ref 0.44–1.00)
GFR, Estimated: >60 mL/min (ref >60)
Glucose, Bld: 107 mg/dL — ABNORMAL HIGH (ref 70–99)
Potassium: 4.2 mmol/L (ref 3.5–5.1)
Sodium: 140 mmol/L (ref 135–145)

## 2023-10-22 LAB — D-DIMER, QUANTITATIVE: D-Dimer, Quant: 0.43 ug{FEU}/mL (ref 0.00–0.50)

## 2023-10-22 LAB — TROPONIN I (HIGH SENSITIVITY): Troponin I (High Sensitivity): <2 ng/L (ref <18)

## 2023-10-22 NOTE — ED Provider Triage Note (Signed)
 Emergency Medicine Provider Triage Evaluation Note  Colleen Blanchard , a 43 y.o. female  was evaluated in triage.  Pt complains of chest pain. Report pressure sensation to L chest radiates to L arm since this AM.  Also endorse headache and pleuritic chest pain with mild SOB.  No fever, chills, cough, n/v/d.  No hx of PE/DVT, no cardiac hx.   Review of Systems  Positive: As above Negative: As above  Physical Exam  BP (!) 148/93 (BP Location: Right Arm)   Pulse (!) 107   Temp 98.5 F (36.9 C)   Resp 18   Ht 5\' 4"  (1.626 m)   Wt 85.2 kg   LMP 10/16/2023   SpO2 100%   BMI 32.24 kg/m  Gen:   Awake, no distress   Resp:  Normal effort  MSK:   Moves extremities without difficulty  Other:    Medical Decision Making  Medically screening exam initiated at 9:46 PM.  Appropriate orders placed.  Colleen Blanchard was informed that the remainder of the evaluation will be completed by another provider, this initial triage assessment does not replace that evaluation, and the importance of remaining in the ED until their evaluation is complete.     Debbra Fairy, PA-C 10/22/23 2147

## 2023-10-22 NOTE — ED Triage Notes (Signed)
 The pt has had chest pain sob and lt arm pain since this am and her bp has been higher headache also

## 2023-10-23 LAB — TROPONIN I (HIGH SENSITIVITY): Troponin I (High Sensitivity): <2 ng/L (ref <18)

## 2023-10-23 MED ORDER — IBUPROFEN 600 MG PO TABS: 600.0000 mg | ORAL_TABLET | Freq: Four times a day (QID) | ORAL | 0 refills | Status: AC | PRN

## 2023-10-23 MED ORDER — HYDROCHLOROTHIAZIDE 25 MG PO TABS: 25.0000 mg | ORAL_TABLET | Freq: Every day | ORAL | 0 refills | Status: AC

## 2023-10-23 NOTE — Discharge Instructions (Addendum)
 Please take medication for blood pressure as prescribed Follow-up with primary care provider as scheduled in June Return if you are having any new or worsening symptoms

## 2023-10-23 NOTE — ED Provider Notes (Signed)
 Colleen Blanchard Provider Note   CSN: [id number] Arrival date & time: 10/22/23  2116     History  Chief Complaint  Patient presents with   Chest Pain   Shortness of Breath    Colleen Blanchard is a 43 y.o. female.  HPI Colleen Blanchard [id number] 43 yo female presents complaining of chest pain left side when awoke am of 4/23 with breathing.  Hurts with deep breathing and laying on left side.  No cough, fever chill, nausea vomiting, no history of dvt or pe, no prior similar symptoms.  She had annual physical in January with high blood pressure and told to get appointment with primary doctor and still waiting.  There is a clinic on [address] that she went to states it was a gyn clinic. No recent pregnancy.      Home Medications Prior to Admission medications   Medication Sig Start Date End Date Taking? Authorizing Provider  hydrochlorothiazide  (HYDRODIURIL ) 25 MG tablet Take 1 tablet (25 mg total) by mouth daily. 10/23/23  Yes Auston Blush, MD  ibuprofen  (ADVIL ) 600 MG tablet Take 1 tablet (600 mg total) by mouth every 6 (six) hours as needed. 10/23/23  Yes Auston Blush, MD  Prenatal Vit-Fe Fumarate-FA (PRENATAL VITAMIN PO) Take by mouth.    [provider]      Allergies    Patient has no known allergies.    Review of Systems   Review of Systems  Physical Exam Updated Vital Signs BP (!) 154/90 (BP Location: Right Arm)   Pulse 84   Temp 97.8 F (36.6 C) (Oral)   Resp 14   Ht 1.626 m (5\' 4" )   Wt 85.2 kg   LMP 10/16/2023   SpO2 93%   BMI 32.24 kg/m  Physical Exam Vitals and nursing note reviewed.  Constitutional:      General: She is not in acute distress.    Appearance: She is well-developed.  HENT:     Head: Normocephalic and atraumatic.     Right Ear: External ear normal.     Left Ear: External ear normal.     Nose: Nose normal.  Eyes:     Conjunctiva/sclera: Conjunctivae normal.     Pupils: Pupils are equal,  round, and reactive to light.  Cardiovascular:     Rate and Rhythm: Normal rate and regular rhythm.     Heart sounds: Normal heart sounds.  Pulmonary:     Effort: Pulmonary effort is normal.     Breath sounds: Normal breath sounds.  Abdominal:     General: Bowel sounds are normal.     Palpations: Abdomen is soft.  Musculoskeletal:        General: Normal range of motion.     Cervical back: Normal range of motion and neck supple.  Skin:    General: Skin is warm and dry.  Neurological:     Mental Status: She is alert and oriented to person, place, and time.     Motor: No abnormal muscle tone.     Coordination: Coordination normal.  Psychiatric:        Behavior: Behavior normal.        Thought Content: Thought content normal.     ED Results / Procedures / Treatments   Labs (all labs ordered are listed, but only abnormal results are displayed) Labs Reviewed  BASIC METABOLIC PANEL WITH GFR - Abnormal; Notable for the following components:      Result Value  Glucose, Bld 107 (*)    All other components within normal limits  CBC  D-DIMER, QUANTITATIVE  TROPONIN I (HIGH SENSITIVITY)  TROPONIN I (HIGH SENSITIVITY)    EKG EKG Interpretation Date/Time:  Thursday October 23 2023 14:45:13 EDT Ventricular Rate:  93 PR Interval:  181 QRS Duration:  89 QT Interval:  360 QTC Calculation: 448 R Axis:   55  Text Interpretation: Sinus rhythm Borderline T abnormalities, anterior leads Confirmed by Auston Blush [phone]) on 10/23/2023 2:51:16 PM  Radiology DG Chest 2 View Result Date: 10/22/2023 CLINICAL DATA:  Chest pain and shortness of breath. EXAM: CHEST - 2 VIEW COMPARISON:  None Available. FINDINGS: The cardiac silhouette, mediastinal and hilar contours are normal. The lungs are clear. No pleural effusions or pulmonary lesions. No pneumothorax. The bony thorax is intact. IMPRESSION: No acute cardiopulmonary findings. Electronically Signed   By: Marrian Siva M.D.   On: 10/22/2023  22:46    Procedures Procedures    Medications Ordered in ED Medications - No data to display  ED Course/ Medical Decision Making/ A&P Clinical Course as of 10/23/23 1458  Thu Oct 23, 2023  1431 CXR reviewed interpreted and normal and radiologist interpretation concurs [DR]  1434 EKG scanned from loc at 2146-sinus tachycardia with nsst  [DR]  1435 Troponin and repeat troponin normal [DR]  1435 D-dimer negative [DR]  1435 Bmet and cbc normal [DR]    Clinical Course User Index [DR] Auston Blush, MD             HEART Score: 1                    Medical Decision Making  Heart score=1 44 yo with pleuritic type chest pain under left breast with some associated dyspnea. No dvt ho or pe ho, not pregnant, no smoking, no cad history Doubt PE-negative d-dimer Doubt cad, normal troponin and pain pleuritic No evidence of infection or pneumothorax Plan ibuprofen , and will rx hydrochlorothiazide  for previously diagnosed hypertension.        Final Clinical Impression(s) / ED Diagnoses Final diagnoses:  Chest pain, unspecified type  Hypertension, unspecified type    Rx / DC Orders ED Discharge Orders          Ordered    ibuprofen  (ADVIL ) 600 MG tablet  Every 6 hours PRN        10/23/23 1456    hydrochlorothiazide  (HYDRODIURIL ) 25 MG tablet  Daily        10/23/23 1456              Auston Blush, MD 10/23/23 1458
